# Patient Record
Sex: Male | Born: 1975 | Race: White | Hispanic: No | Marital: Married | Smoking: Former smoker
Health system: Southern US, Community
[De-identification: ages and names within clinical notes are randomized; demographics above are authoritative.]

## PROBLEM LIST (undated history)

## (undated) DIAGNOSIS — M1990 Unspecified osteoarthritis, unspecified site: Secondary | ICD-10-CM

## (undated) DIAGNOSIS — M2550 Pain in unspecified joint: Secondary | ICD-10-CM

## (undated) DIAGNOSIS — M549 Dorsalgia, unspecified: Secondary | ICD-10-CM

## (undated) DIAGNOSIS — E785 Hyperlipidemia, unspecified: Secondary | ICD-10-CM

## (undated) DIAGNOSIS — F909 Attention-deficit hyperactivity disorder, unspecified type: Secondary | ICD-10-CM

## (undated) HISTORY — DX: Dorsalgia, unspecified: M54.9

## (undated) HISTORY — PX: EYE SURGERY: SHX253

## (undated) HISTORY — PX: KNEE SURGERY: SHX244

## (undated) HISTORY — PX: HAND SURGERY: SHX662

## (undated) HISTORY — DX: Attention-deficit hyperactivity disorder, unspecified type: F90.9

## (undated) HISTORY — DX: Hyperlipidemia, unspecified: E78.5

## (undated) HISTORY — DX: Unspecified osteoarthritis, unspecified site: M19.90

## (undated) HISTORY — DX: Pain in unspecified joint: M25.50

---

## 2001-02-05 ENCOUNTER — Encounter (INDEPENDENT_AMBULATORY_CARE_PROVIDER_SITE_OTHER): Payer: Self-pay | Admitting: Specialist

## 2001-02-05 ENCOUNTER — Ambulatory Visit (HOSPITAL_BASED_OUTPATIENT_CLINIC_OR_DEPARTMENT_OTHER): Admission: RE | Admit: 2001-02-05 | Discharge: 2001-02-05 | Payer: Self-pay | Admitting: Plastic Surgery

## 2002-01-23 ENCOUNTER — Encounter: Payer: Self-pay | Admitting: Emergency Medicine

## 2002-01-23 ENCOUNTER — Encounter: Admission: RE | Admit: 2002-01-23 | Discharge: 2002-01-23 | Payer: Self-pay | Admitting: Emergency Medicine

## 2007-10-23 ENCOUNTER — Ambulatory Visit (HOSPITAL_BASED_OUTPATIENT_CLINIC_OR_DEPARTMENT_OTHER): Admission: RE | Admit: 2007-10-23 | Discharge: 2007-10-23 | Payer: Self-pay | Admitting: *Deleted

## 2009-05-18 ENCOUNTER — Encounter: Admission: RE | Admit: 2009-05-18 | Discharge: 2009-05-18 | Payer: Self-pay | Admitting: Family Medicine

## 2010-01-31 ENCOUNTER — Encounter: Admission: RE | Admit: 2010-01-31 | Discharge: 2010-01-31 | Payer: Self-pay | Admitting: Family Medicine

## 2010-09-13 ENCOUNTER — Encounter
Admission: RE | Admit: 2010-09-13 | Discharge: 2010-09-13 | Payer: Self-pay | Source: Home / Self Care | Attending: Family Medicine | Admitting: Family Medicine

## 2011-02-14 NOTE — Op Note (Signed)
Sean Hurst, Sean Hurst                 ACCOUNT NO.:  [id number]   MEDICAL RECORD NO.:  [id number]          PATIENT TYPE:  AMB   LOCATION:  DSC                          FACILITY:  [facility]   PHYSICIAN:  Lowell Bouton, M.D.DATE OF BIRTH:  Aug 15, 1976   DATE OF PROCEDURE:  10/23/2007  DATE OF DISCHARGE:                               OPERATIVE REPORT   PREOP DIAGNOSIS:  Malunion left fourth metacarpal shaft fracture.   POSTOPERATIVE DIAGNOSES:  Malunion left fourth metacarpal shaft  fracture.   PROCEDURE:  Open reduction internal fixation left fourth metacarpal  shaft fracture.   SURGEON:  Lowell Bouton, M.D.   ANESTHESIA:  General.   OPERATIVE FINDINGS:  The patient had an oblique fracture of the fourth  metacarpal shaft with a butterfly fragment that was just about healed.  There was significant callus around it but it was malrotated toward the  small finger.  The edges of the bone were rounded off and not sharp as  with an acute fracture.   PROCEDURE:  Under general anesthesia with a tourniquet on the left arm,  the left hand was prepped and draped in usual fashion.  After  exsanguinating the limb the tourniquet was inflated to 250 mmHg.  A  longitudinal incision was made over the dorsum of fourth metacarpal and  carried through the subcutaneous tissues.  Bleeding points were  coagulated.  Blunt dissection was carried down to the extensor tendon  and it was retracted ulnarly.  Longitudinal incision was made through  the periosteum down to the fourth metacarpal.  The fracture site was  identified and a Weitlaner retractor was inserted for retraction.  A  Freer elevator was used to elevate the periosteum and expose the  fracture site.  A rongeur was used to remove the callus that had formed  which was significant.  After taking down the fracture completely the  fracture was reduced as close to anatomic as could be determined with  two clamps.  An 0.45 K-wire was  placed temporarily obliquely across the  fracture to hold the rotation.  Clinically the rotation looked good.  A  mini fragment six-hole plate was then applied and after removing the K-  wire and the clamps the finger was still malrotated.  At this point the  plate was removed and the fracture was again reduced to get correct  rotation.  Another 0.35 K-wire was inserted temporarily along with a  0.45 K-wire at the fracture site.  A 0.45 longitudinal K-wire was then  placed from the MP joint proximally down the shaft of the fourth  metacarpal to the base of the metacarpal holding it aligned.  This pin  was bent over and left protruding from the skin.  A 0.45 K-wire was then  placed through the fifth metacarpal percutaneously into the distal  fragment of the fourth metacarpal holding it aligned.  Another 0.45 care  was then placed through the proximal fifth metacarpal into the proximal  fourth metacarpal holding it aligned.  A 0.35 K-wire was placed adjacent  to the more proximal 0.45 K-wire allowing two  proximal K-wires, one  distal K-wire and an a longitudinal K-wire.  The temporary K-wires were  removed.  X-rays showed good alignment and rotation was appropriate.  The K-wires were bent over and left protruding from the skin.  The wound  was irrigated with saline.  The periosteum was closed with 4-0 Vicryl, a  vessel loop drain was left in for drainage.  Half percent Marcaine was  placed in the skin edges for pain control.  Subcutaneous tissue was  closed with  4-0 Vicryl.  The skin with a 3-0 subcuticular Prolene.  Steri-Strips  were applied followed by sterile dressings and a dorsal splint.  The  tourniquet was released with good circulation of the hand.  The patient  went to recovery room awake and in stable condition.      Lowell Bouton, M.D.  Electronically Signed     EMM/MEDQ  D:  10/23/2007  T:  10/23/2007  Job:  [id number]

## 2011-02-17 NOTE — Op Note (Signed)
[place]  Patient:    Sean Hurst, Sean Hurst                        MRN: [id number] Proc. Date: 02/05/01 Adm. Date:  [id number] Attending:  Loura Halt Ii                           Operative Report  PREOPERATIVE DIAGNOSIS:  A 1 cm right lateral epidermoid cyst.  POSTOPERATIVE DIAGNOSIS:  A 1 cm right lateral epidermoid cyst.  OPERATION PERFORMED:  Excision right lateral epidermoid cyst.  SURGEON:  Alfredia Ferguson, M.D.  ANESTHESIA:  2% Xylocaine, 1:100,000 epinephrine.  INDICATIONS FOR PROCEDURE:  The patient is a 35 year old male with a history of a cyst in his right lateral eyebrow which has been present for quite some time.  It clinically appears to be a epidermoid cyst.  The patient wishes to have it excised.  He understands the risks of scarring and recurrence.  In spite of that he wishes to proceed with the surgery.  DESCRIPTION OF PROCEDURE:  A 1 cm curvilinear incision was made directly over the cyst and local anesthesia was infiltrated.  The area was prepped and draped in sterile fashion.  After waiting approximately 10 minutes, an incision was made over the cyst and dissection of the skin off the cyst was carried out.  The cyst was dissected away from the surrounding tissue.  It was passed off for pathology.  Hemostasis was accomplished using pressure.  The wound was closed using multiple interrupted 6-0 nylon sutures.  A light dressing was applied and the patient was discharged home in satisfactory condition. DD:  02/05/01 TD:  02/05/01 Job: [id number]

## 2011-06-23 LAB — POCT HEMOGLOBIN-HEMACUE: Hemoglobin: 15

## 2012-12-16 ENCOUNTER — Ambulatory Visit
Admission: RE | Admit: 2012-12-16 | Discharge: 2012-12-16 | Disposition: A | Discharge status: Home / Self Care | Payer: 59 | Source: Ambulatory Visit | Attending: Family Medicine | Admitting: Family Medicine

## 2012-12-16 ENCOUNTER — Other Ambulatory Visit: Payer: Self-pay | Admitting: Family Medicine

## 2012-12-16 DIAGNOSIS — R609 Edema, unspecified: Secondary | ICD-10-CM

## 2012-12-16 DIAGNOSIS — R52 Pain, unspecified: Secondary | ICD-10-CM

## 2013-06-19 ENCOUNTER — Ambulatory Visit
Admission: RE | Admit: 2013-06-19 | Discharge: 2013-06-19 | Disposition: A | Discharge status: Home / Self Care | Payer: 59 | Source: Ambulatory Visit | Attending: Family Medicine | Admitting: Family Medicine

## 2013-06-19 ENCOUNTER — Other Ambulatory Visit: Payer: Self-pay | Admitting: Family Medicine

## 2013-06-19 ENCOUNTER — Other Ambulatory Visit: Payer: Self-pay | Admitting: Pediatrics

## 2013-06-19 DIAGNOSIS — M25561 Pain in right knee: Secondary | ICD-10-CM

## 2014-10-09 ENCOUNTER — Other Ambulatory Visit: Payer: Self-pay | Admitting: Orthopaedic Surgery

## 2014-10-09 DIAGNOSIS — M25561 Pain in right knee: Secondary | ICD-10-CM

## 2014-10-14 ENCOUNTER — Ambulatory Visit
Admission: RE | Admit: 2014-10-14 | Discharge: 2014-10-14 | Disposition: A | Discharge status: Home / Self Care | Payer: 59 | Source: Ambulatory Visit | Attending: Orthopaedic Surgery | Admitting: Orthopaedic Surgery

## 2014-10-14 DIAGNOSIS — M25561 Pain in right knee: Secondary | ICD-10-CM

## 2015-02-24 ENCOUNTER — Ambulatory Visit (INDEPENDENT_AMBULATORY_CARE_PROVIDER_SITE_OTHER): Payer: 59 | Admitting: Family Medicine

## 2015-02-24 ENCOUNTER — Ambulatory Visit (INDEPENDENT_AMBULATORY_CARE_PROVIDER_SITE_OTHER): Payer: 59

## 2015-02-24 VITALS — BP 110/70 | HR 81 | Temp 97.9°F | Resp 16 | Ht 79.0 in | Wt 318.0 lb

## 2015-02-24 DIAGNOSIS — S6991XA Unspecified injury of right wrist, hand and finger(s), initial encounter: Secondary | ICD-10-CM

## 2015-02-24 DIAGNOSIS — M79644 Pain in right finger(s): Secondary | ICD-10-CM | POA: Diagnosis not present

## 2015-02-24 NOTE — Progress Notes (Signed)
  Subjective:  Patient ID: Sean ClicheMark H Cangemi, male    DOB: 1976/01/03  Age: 39 y.o. MRN: [id number]  Patient was playing with his kids and let them tackle him. When he went down he felt his finger go snap. He is right-handed. Has not had any prior problems with it. It hurts in the proximal joint.   Objective:   Mild lateral deviation of PIP joint of middle finger, swollen. Tender to touch running around the joint space. Minimal flexion and extension. The bones on either side of the joint don't seem terribly tender. Sensation is intact.  [facility] reading (PRIMARY) by  Dr. Alwyn RenHopper. No fracture.  Mild    Assessment & Plan:   Assessment: Pain, strain right middle finger  Plan: Patient Instructions  Take ibuprofen 800 mg (4200) 3 times daily or Aleve (naproxen) 440 mg (2220) twice daily  Referral is being made to an orthopedic hand specialist  Keep the finger buddy taped  Apply ice for about 15 minutes for 5 times a day for the next few days  Return at anytime if we can be of assistance     HOPPER,DAVID, MD 02/24/2015

## 2015-02-24 NOTE — Patient Instructions (Signed)
Take ibuprofen 800 mg (4200) 3 times daily or Aleve (naproxen) 440 mg (2220) twice daily  Referral is being made to an orthopedic hand specialist  Keep the finger buddy taped  Apply ice for about 15 minutes for 5 times a day for the next few days  Return at anytime if we can be of assistance

## 2015-04-19 ENCOUNTER — Ambulatory Visit (INDEPENDENT_AMBULATORY_CARE_PROVIDER_SITE_OTHER): Payer: 59

## 2015-04-19 ENCOUNTER — Ambulatory Visit (INDEPENDENT_AMBULATORY_CARE_PROVIDER_SITE_OTHER): Payer: 59 | Admitting: Family Medicine

## 2015-04-19 VITALS — BP 118/78 | HR 85 | Temp 97.9°F | Resp 16 | Ht 78.0 in | Wt 313.0 lb

## 2015-04-19 DIAGNOSIS — M79641 Pain in right hand: Secondary | ICD-10-CM

## 2015-04-19 DIAGNOSIS — S62309A Unspecified fracture of unspecified metacarpal bone, initial encounter for closed fracture: Secondary | ICD-10-CM | POA: Diagnosis not present

## 2015-04-19 DIAGNOSIS — S62339A Displaced fracture of neck of unspecified metacarpal bone, initial encounter for closed fracture: Secondary | ICD-10-CM

## 2015-04-19 NOTE — Patient Instructions (Addendum)
Please wear the splint when working and if using right hand.  You can take it off to shower, air out as needed.  I've referred you to hand and they will be in contact with you to get that scheduled.    Boxer's Fracture You have a break (fracture) of the fifth metacarpal bone. This is commonly called a boxer's fracture. This is the bone in the hand where the little finger attaches. The fracture is in the end of that bone, closest to the little finger. It is usually caused when you hit an object with a clenched fist. Often, the knuckle is pushed down by the impact. Sometimes, the fracture rotates out of position. A boxer's fracture will usually heal within 6 weeks, if it is treated properly and protected from re-injury. Surgery is sometimes needed. A cast, splint, or bulky hand dressing may be used to protect and immobilize a boxer's fracture. Do not remove this device or dressing until your caregiver approves. Keep your hand elevated, and apply ice packs for 15-20 minutes every 2 hours, for the first 2 days. Elevation and ice help reduce swelling and relieve pain. See your caregiver, or an orthopedic specialist, for follow-up care within the next 10 days. This is to make sure your fracture is healing properly. Document Released: 09/18/2005 Document Revised: 12/11/2011 Document Reviewed: 03/08/2007 [facility] Patient Information 2015 [place], [place]. This information is not intended to replace advice given to you by your health care provider. Make sure you discuss any questions you have with your health care provider.

## 2015-04-19 NOTE — Progress Notes (Signed)
   Subjective:    Patient ID: Sean Hurst, male    DOB: 1976/08/30, 39 y.o.   MRN: [id number]  Chief Complaint  Patient presents with  . pain in right hand    x 2wks   There are no active problems to display for this patient.  Prior to Admission medications   Not on File   Medications, allergies, past medical history, surgical history, family history, social history and problem list reviewed and updated.  HPI  39 yom presents with right hand pain.   Was seen here 5/26 with right 3rd digit injury. Xrays normal. Was referred to hand but never saw them as pain improved.   Presents today with right hand pain. His dog got into fight with another dog and he had to hit his dog in jaw with closed fist to try to get dog to release from another dog. Tender/swollen over right 5th digit past 2 wks since then.   Review of Systems No fevers, chills.     Objective:   Physical Exam  Constitutional: He is oriented to person, place, and time. He appears well-developed and well-nourished.  Non-toxic appearance. He does not have a sickly appearance. He does not appear ill. No distress.  BP 118/78 mmHg  Pulse 85  Temp(Src) 97.9 F (36.6 C) (Oral)  Resp 16  Ht 6\' 6"  (1.981 m)  Wt 313 lb (141.976 kg)  BMI 36.18 kg/m2  SpO2 97%   Musculoskeletal:       Right elbow: Normal.      Right wrist: Normal.       Right hand: He exhibits tenderness and bony tenderness. He exhibits normal range of motion. Normal sensation noted. Normal strength noted.       Hands: Swelling, tenderness centered over distal 5th metatarsal.   Neurological: He is alert and oriented to person, place, and time.   [facility] reading (PRIMARY) by  Dr. Alwyn RenHopper. Right hand findings: Distal 5th metacarpal fx. Angulated 35 degrees.      Assessment & Plan:   Right hand pain - Plan: DG Hand Complete Right --boxers fx --ulnar gutter splint placed --referral to hand placed urgently today  Donnajean Lopesodd M. Ambrie Carte, PA-C Physician  Assistant-Certified [facility]  04/19/2015 2:16 PM

## 2015-04-19 NOTE — Progress Notes (Signed)
  Subjective:  Patient ID: Sean ClicheMark H Norden, male    DOB: 07-21-1976  Age: 39 y.o. MRN: [id number]  Patient struck the dog trying to break up a dog fight, injuring his right hand 2 weeks ago.   McVay, PA-C and I discussed this.   Objective:   X-ray was reviewed and the boxer's fracture at an angle of about 35 was noted in the distal right fifth metacarpal.  Assessment & Plan:   Assessment: Fracture fifth metacarpal, angulated  Treatment plan discussed and agreed upon. Will be referred to orthopedics for hand evaluation. I spoke to patient briefly.  Plan:  Per orders and instructions. HOPPER,DAVID, MD 04/19/2015

## 2017-07-11 ENCOUNTER — Ambulatory Visit
Admission: RE | Admit: 2017-07-11 | Discharge: 2017-07-11 | Disposition: A | Discharge status: Home / Self Care | Payer: 59 | Source: Ambulatory Visit | Attending: Family Medicine | Admitting: Family Medicine

## 2017-07-11 ENCOUNTER — Other Ambulatory Visit: Payer: Self-pay | Admitting: Family Medicine

## 2017-07-11 DIAGNOSIS — M25562 Pain in left knee: Secondary | ICD-10-CM

## 2020-02-25 ENCOUNTER — Other Ambulatory Visit: Payer: Self-pay

## 2020-02-25 ENCOUNTER — Other Ambulatory Visit: Payer: Self-pay | Admitting: Family Medicine

## 2020-02-25 ENCOUNTER — Ambulatory Visit
Admission: RE | Admit: 2020-02-25 | Discharge: 2020-02-25 | Disposition: A | Discharge status: Home / Self Care | Payer: 59 | Source: Ambulatory Visit | Attending: Family Medicine | Admitting: Family Medicine

## 2020-02-25 DIAGNOSIS — M25421 Effusion, right elbow: Secondary | ICD-10-CM

## 2020-12-23 ENCOUNTER — Ambulatory Visit (INDEPENDENT_AMBULATORY_CARE_PROVIDER_SITE_OTHER): Payer: 59

## 2020-12-23 ENCOUNTER — Other Ambulatory Visit: Payer: Self-pay

## 2020-12-23 ENCOUNTER — Ambulatory Visit: Payer: 59 | Admitting: Podiatry

## 2020-12-23 ENCOUNTER — Encounter: Payer: Self-pay | Admitting: Podiatry

## 2020-12-23 DIAGNOSIS — M7662 Achilles tendinitis, left leg: Secondary | ICD-10-CM

## 2020-12-23 DIAGNOSIS — M722 Plantar fascial fibromatosis: Secondary | ICD-10-CM

## 2020-12-23 NOTE — Progress Notes (Signed)
Subjective:   Patient ID: Sean Hurst, male   DOB: 45 y.o.   MRN: [id number]   HPI Patient states he had severe pain in the back of his left heel that he wanted to get checked states it is improving but still has concerns and has numerous questions concerning foot pain.  Patient does not smoke likes to be active   Review of Systems  All other systems reviewed and are negative.       Objective:  Physical Exam Vitals and nursing note reviewed.  Constitutional:      Appearance: He is well-developed.  Pulmonary:     Effort: Pulmonary effort is normal.  Musculoskeletal:        General: Normal range of motion.  Skin:    General: Skin is warm.  Neurological:     Mental Status: He is alert.     Neurovascular status found to be intact muscle strength adequate range of motion adequate mild equinus noted bilateral.  Posterior discomfort still present left heel there does appear to be some enlargement of the posterior heel with possibility for bone spur formation with patient found to have good digital perfusion well oriented x3 with a weightbearing job with lots of walking and standing     Assessment:  Achilles tendinitis left with probable spur formation with inflammation that developed     Plan:  H&P reviewed condition and at this point we discussed ice therapy orthotic therapy possibly heel lift therapy and topical medications.  I did explain it may require more aggressive conservative approach if it were to flareup again and that there is spur formation which may need to be addressed at 1 point in the future and I did educate him on the possibility for surgical intervention  X-rays indicate that there is some posterior spur formation of the left heel and moderate on the plantar aspect of the heel

## 2020-12-23 NOTE — Patient Instructions (Signed)

## 2021-06-08 ENCOUNTER — Other Ambulatory Visit: Payer: Self-pay | Admitting: Family Medicine

## 2021-06-08 ENCOUNTER — Ambulatory Visit
Admission: RE | Admit: 2021-06-08 | Discharge: 2021-06-08 | Disposition: A | Discharge status: Home / Self Care | Payer: 59 | Source: Ambulatory Visit | Attending: Family Medicine | Admitting: Family Medicine

## 2021-06-08 DIAGNOSIS — R52 Pain, unspecified: Secondary | ICD-10-CM

## 2022-01-18 ENCOUNTER — Ambulatory Visit
Admission: RE | Admit: 2022-01-18 | Discharge: 2022-01-18 | Disposition: A | Discharge status: Home / Self Care | Payer: 59 | Source: Ambulatory Visit | Attending: Family Medicine | Admitting: Family Medicine

## 2022-01-18 ENCOUNTER — Other Ambulatory Visit: Payer: Self-pay | Admitting: Family Medicine

## 2022-01-18 DIAGNOSIS — R52 Pain, unspecified: Secondary | ICD-10-CM

## 2022-01-25 ENCOUNTER — Ambulatory Visit: Payer: 59 | Admitting: Orthopaedic Surgery

## 2022-01-25 ENCOUNTER — Encounter: Payer: Self-pay | Admitting: Orthopaedic Surgery

## 2022-01-25 DIAGNOSIS — M1711 Unilateral primary osteoarthritis, right knee: Secondary | ICD-10-CM | POA: Diagnosis not present

## 2022-01-25 MED ORDER — METHYLPREDNISOLONE ACETATE 40 MG/ML IJ SUSP
40.0000 mg | INTRAMUSCULAR | Status: AC | PRN
  Administered 2022-01-25: 40 mg via INTRA_ARTICULAR

## 2022-01-25 MED ORDER — LIDOCAINE HCL 1 % IJ SOLN
3.0000 mL | INTRAMUSCULAR | Status: AC | PRN
  Administered 2022-01-25: 3 mL

## 2022-01-25 NOTE — Progress Notes (Signed)
? ?Office Visit Note ?  ?Patient: Sean Hurst           ?Date of Birth: 1975-10-14           ?MRN: [id number] ?Visit Date: 01/25/2022 ?             ?Requested by: Mosetta Putt, MD ?[address] ?[place],  [place] [address] ?PCP: Mosetta Putt, MD ? ? ?Assessment & Plan: ?Visit Diagnoses:  ?1. Primary osteoarthritis of right knee   ? ? ?Plan: Discussed with him knee friendly exercises and quad strengthening.  Also discussed weight loss.  Recommend use of topical Voltaren gel 4 g up to 4 times daily.  He also ask about knee brace which I think could be beneficial he will pick this up off the shelf.  We will see how he does with the cortisone injection if he has short lived relief then may consider supplemental injection handout on supplemental injection was given.  Questions were encouraged and answered by Dr. [name] and myself. ? ?Follow-Up Instructions: No follow-ups on file.  ? ?Orders:  ?Orders Placed This Encounter  ?Procedures  ? Large Joint Inj  ? ?No orders of the defined types were placed in this encounter. ? ? ? ? Procedures: ?Large Joint Inj: R knee on 01/25/2022 10:21 AM ?Indications: pain ?Details: 22 G 1.5 in needle, anterolateral approach ? ?Arthrogram: No ? ?Medications: 3 mL lidocaine 1 %; 40 mg methylPREDNISolone acetate 40 MG/ML ?Outcome: tolerated well, no immediate complications ?Procedure, treatment alternatives, risks and benefits explained, specific risks discussed. Consent was given by the patient. Immediately prior to procedure a time out was called to verify the correct patient, procedure, equipment, support staff and site/side marked as required. Patient was prepped and draped in the usual sterile fashion.  ? ? ? ? ?Clinical Data: ?No additional findings. ? ? ?Subjective: ?Chief Complaint  ?Patient presents with  ? Right Knee - Pain  ? ? ?HPI ?Sean Hurst is 46 year old male comes in today with right knee pain has been ongoing for about a month and a half.  No known injury.  States pain is  worse on some days than others.  He notes pain medial aspect of the knee.  Does have a history of knee arthroscopy with what sounds like a partial medial meniscectomy in 2015.  He has tried Advil ibuprofen without much relief.  Denies any mechanical symptoms right knee.  Points to the medial aspect of the knee as source of pain.  Patient is nondiabetic. ?Radiographs right knee dated 01/18/2022 showed moderate medial compartmental narrowing and mild patellofemoral arthritic changes.  No acute fractures.  Lateral compartment is well-maintained.  Knee is well located. ? ?Review of Systems  ?Constitutional:  Negative for chills and fever.  ? ? ?Objective: ?Vital Signs: There were no vitals taken for this visit. ? ?Physical Exam ?Constitutional:   ?   Appearance: He is not ill-appearing or diaphoretic.  ?Pulmonary:  ?   Effort: Pulmonary effort is normal.  ?Neurological:  ?   Mental Status: He is alert and oriented to person, place, and time.  ?Psychiatric:     ?   Mood and Affect: Mood normal.  ? ? ?Ortho Exam ?Bilateral knees: Full extension full flexion.  Right knee with slight patellofemoral crepitus.  Tenderness along medial joint line right knee only.  No instability with valgus varus stressing of either knee.  No abnormal warmth erythema or effusion of either knee. ?Specialty Comments:  ?No specialty comments available. ? ?  Imaging: ?No results found. ? ? ?PMFS History: ?There are no problems to display for this patient. ? ?No past medical history on file.  ?No family history on file.  ?Past Surgical History:  ?Procedure Laterality Date  ? EYE SURGERY    ? HAND SURGERY Left   ? KNEE SURGERY Right   ? ?Social History  ? ?Occupational History  ? Not on file  ?Tobacco Use  ? Smoking status: Never  ? Smokeless tobacco: Not on file  ?Substance and Sexual Activity  ? Alcohol use: Not on file  ? Drug use: Not on file  ? Sexual activity: Not on file  ? ? ? ? ? ? ?

## 2022-08-09 ENCOUNTER — Ambulatory Visit: Payer: 59 | Admitting: Orthopaedic Surgery

## 2022-08-09 ENCOUNTER — Telehealth: Payer: 59

## 2022-08-09 VITALS — Ht 78.0 in | Wt 328.0 lb

## 2022-08-09 DIAGNOSIS — M25561 Pain in right knee: Secondary | ICD-10-CM

## 2022-08-09 DIAGNOSIS — M1711 Unilateral primary osteoarthritis, right knee: Secondary | ICD-10-CM | POA: Diagnosis not present

## 2022-08-09 DIAGNOSIS — G8929 Other chronic pain: Secondary | ICD-10-CM

## 2022-08-09 MED ORDER — METHYLPREDNISOLONE ACETATE 40 MG/ML IJ SUSP
40.0000 mg | INTRAMUSCULAR | Status: AC | PRN
  Administered 2022-08-09: 40 mg via INTRA_ARTICULAR

## 2022-08-09 MED ORDER — LIDOCAINE HCL 1 % IJ SOLN
3.0000 mL | INTRAMUSCULAR | Status: AC | PRN
  Administered 2022-08-09: 3 mL

## 2022-08-09 NOTE — Telephone Encounter (Signed)
Right knee gel injection  

## 2022-08-09 NOTE — Progress Notes (Addendum)
The patient is well-known to the clinic.  He is an active 46 year old gentleman who works outside in for [employer].  He has moderate arthritis in his right about a month ago.  He is definitely candidate for hyaluronic acid.  Previous x-rays showed knee.  He has had a steroid injection back in the spring and that helped him greatly until some joint space narrowing and osteophytes around the knee but not bone-on-bone wear.  He denies any locking catching in his knee is more painful when he has been up on the elevated long.  He has had no acute change in medical status.  He is a very tall individual and his BMI is almost 38.  Examination of his left knee today is normal.  Examination of the right knee shows some medial joint line tenderness and patellofemoral rotation but good range of motion overall and no instability on exam.  His Lachman's and McMurray's exams are negative.  I did place a steroid injection in his right knee today while we wait to get approval for hyaluronic acid for his right knee.  I believe that is the best treatment option for treating the pain from osteoarthritis of his left knee.  We will hopefully get this ordered for him and approved.  We will see him in follow-up to place an injection in his right knee.  All question concerns were answered and addressed.       Procedure Note  Patient: Sean Hurst             Date of Birth: 11-21-1975           MRN: [id number]             Visit Date: 08/09/2022  Procedures: Visit Diagnoses:  1. Chronic pain of right knee   2. Unilateral primary osteoarthritis, right knee     Large Joint Inj: R knee on 08/09/2022 3:47 PM Indications: diagnostic evaluation and pain Details: 22 G 1.5 in needle, superolateral approach  Arthrogram: No  Medications: 3 mL lidocaine 1 %; 40 mg methylPREDNISolone acetate 40 MG/ML Outcome: tolerated well, no immediate complications Procedure, treatment alternatives, risks and benefits explained,  specific risks discussed. Consent was given by the patient. Immediately prior to procedure a time out was called to verify the correct patient, procedure, equipment, support staff and site/side marked as required. Patient was prepped and draped in the usual sterile fashion.

## 2022-08-11 NOTE — Telephone Encounter (Signed)
VOB submitted for Durolane, right knee.  

## 2022-08-23 ENCOUNTER — Other Ambulatory Visit (HOSPITAL_BASED_OUTPATIENT_CLINIC_OR_DEPARTMENT_OTHER): Payer: Self-pay | Admitting: Geriatric Medicine

## 2022-08-23 DIAGNOSIS — E785 Hyperlipidemia, unspecified: Secondary | ICD-10-CM

## 2022-10-04 ENCOUNTER — Encounter (HOSPITAL_BASED_OUTPATIENT_CLINIC_OR_DEPARTMENT_OTHER): Payer: Self-pay

## 2022-10-04 ENCOUNTER — Ambulatory Visit (HOSPITAL_BASED_OUTPATIENT_CLINIC_OR_DEPARTMENT_OTHER)
Admission: RE | Admit: 2022-10-04 | Discharge: 2022-10-04 | Disposition: A | Discharge status: Home / Self Care | Payer: 59 | Source: Ambulatory Visit | Attending: Geriatric Medicine | Admitting: Geriatric Medicine

## 2022-10-04 DIAGNOSIS — E785 Hyperlipidemia, unspecified: Secondary | ICD-10-CM | POA: Insufficient documentation

## 2022-10-12 ENCOUNTER — Other Ambulatory Visit (HOSPITAL_COMMUNITY): Payer: Self-pay | Admitting: Family Medicine

## 2022-10-12 DIAGNOSIS — E785 Hyperlipidemia, unspecified: Secondary | ICD-10-CM

## 2022-11-21 ENCOUNTER — Telehealth: Payer: Self-pay | Admitting: Orthopaedic Surgery

## 2022-11-21 NOTE — Telephone Encounter (Signed)
Patient wants to Know if Gel shot was approved [id number]

## 2022-11-21 NOTE — Telephone Encounter (Signed)
Talked with patient concerning gel injection and advised him that gel injection would need to be resubmitted due to it being a new year.  Patient voiced that he understands.

## 2022-11-22 ENCOUNTER — Telehealth: Payer: Self-pay

## 2022-11-22 DIAGNOSIS — M1711 Unilateral primary osteoarthritis, right knee: Secondary | ICD-10-CM

## 2022-11-22 NOTE — Telephone Encounter (Signed)
Called and left a VM for patient to CB to schedule for gel injection with Dr. Ninfa Linden or Artis Delay.  Check referral tab

## 2022-12-04 ENCOUNTER — Encounter: Payer: Self-pay | Admitting: Physician Assistant

## 2022-12-04 ENCOUNTER — Ambulatory Visit: Payer: 59 | Admitting: Physician Assistant

## 2022-12-04 DIAGNOSIS — M1711 Unilateral primary osteoarthritis, right knee: Secondary | ICD-10-CM | POA: Diagnosis not present

## 2022-12-04 MED ORDER — LIDOCAINE HCL 1 % IJ SOLN
3.0000 mL | INTRAMUSCULAR | Status: AC | PRN
  Administered 2022-12-04: 3 mL

## 2022-12-04 MED ORDER — SODIUM HYALURONATE 60 MG/3ML IX PRSY
60.0000 mg | PREFILLED_SYRINGE | INTRA_ARTICULAR | Status: AC | PRN
  Administered 2022-12-04: 60 mg via INTRA_ARTICULAR

## 2022-12-04 NOTE — Progress Notes (Signed)
   Procedure Note  Patient: Sean Hurst             Date of Birth: 1976-07-22           MRN: [id number]             Visit Date: 12/04/2022  HPI: Mr. Ambrogio comes in today for scheduled Durolane injection in his right knee.  Again he has known arthritis involving the medial compartment which is moderate on radiograph.  Mild patellofemoral changes.  Said no new injuries no changes to his overall pain in the knee.  He has tried cortisone injections without significant relief.  Also he has tried NSAIDs without much relief.  He has no scheduled knee surgery on the right knee in the next 6 months.  Physical exam: General well-developed well-nourished male no acute distress Right knee: No abnormal warmth or erythema.  Slight effusion.  Overall good range of motion of the knee.  Procedures: Visit Diagnoses:  1. Unilateral primary osteoarthritis, right knee     Large Joint Inj: R knee on 12/04/2022 4:34 PM Indications: pain Details: 22 G 1.5 in needle, superolateral approach  Arthrogram: No  Medications: 3 mL lidocaine 1 %; 60 mg Sodium Hyaluronate 60 MG/3ML Aspirate: 7 mL blood-tinged Outcome: tolerated well, no immediate complications Procedure, treatment alternatives, risks and benefits explained, specific risks discussed. Consent was given by the patient. Immediately prior to procedure a time out was called to verify the correct patient, procedure, equipment, support staff and site/side marked as required. Patient was prepped and draped in the usual sterile fashion.    Plan: Knee was wrapped with Ace bandages which she will remove this evening.  He knows to wait at least 6 months between injections.  Questions were encouraged and answered at length

## 2022-12-07 ENCOUNTER — Encounter: Payer: Self-pay | Admitting: Radiology

## 2022-12-27 ENCOUNTER — Other Ambulatory Visit (INDEPENDENT_AMBULATORY_CARE_PROVIDER_SITE_OTHER): Payer: 59

## 2022-12-27 ENCOUNTER — Ambulatory Visit: Payer: 59 | Admitting: Orthopaedic Surgery

## 2022-12-27 DIAGNOSIS — M25551 Pain in right hip: Secondary | ICD-10-CM

## 2022-12-27 DIAGNOSIS — M25561 Pain in right knee: Secondary | ICD-10-CM

## 2022-12-27 DIAGNOSIS — G8929 Other chronic pain: Secondary | ICD-10-CM

## 2022-12-27 MED ORDER — METHYLPREDNISOLONE ACETATE 40 MG/ML IJ SUSP
40.0000 mg | INTRAMUSCULAR | Status: AC | PRN
  Administered 2022-12-27: 40 mg via INTRA_ARTICULAR

## 2022-12-27 MED ORDER — LIDOCAINE HCL 1 % IJ SOLN
3.0000 mL | INTRAMUSCULAR | Status: AC | PRN
  Administered 2022-12-27: 3 mL

## 2022-12-27 NOTE — Progress Notes (Signed)
The patient is well-known to [place].  He is 47 years old and we have been treating him for mild to moderate arthritis of his right knee.  He has had a steroid injection in that knee that helped greatly but a hyaluronic acid injection he had just a month ago does not really help much at all.  He still has medial joint line tenderness along that right knee.  His job does involve a lot of walking.  He is mainly here today though for his right hip.  He has been having lateral hip pain since July of last year which has been about 8 months now.  He had a very hard mechanical fall then landing directly on that hip and had severe bruising.  He denies any groin pain but he has been still continuing to have pain on his right hip over the trochanteric area but also the medial aspect of his right knee.  He did let us know that the co-pay for his gel injection was $500.  On exam his right hip moves smoothly and fluidly.  There is pain over the trochanteric area only in the proximal IT band of that hip.  An AP pelvis and lateral of the right hip are normal.  I did recommend a steroid injection around the trochanteric area of his right hip and he agreed to this and tolerated it well.  I would like to obtain an MRI of his right knee at this standpoint given the fact that an MRI in 2016 was entirely normal in terms of no cartilage deficit but now he is experiencing enough pain and is failing conservative treatment including activity modification, physical therapy, steroid injections and now hyaluronic acid injections of the right knee.  The MRI will help assess what the cartilage looks like as well as if there is a meniscal tear.  He may end up benefiting from the medial compartment offloading knee brace but we need to see what the MRI shows first.  He will call for follow-up appointment once we know when he has the MRI of his right knee.     Procedure Note  Patient: Sean Hurst             Date of Birth: 18-Mar-1976            MRN: [id number]             Visit Date: 12/27/2022  Procedures: Visit Diagnoses:  1. Pain in right hip   2. Chronic pain of right knee     Large Joint Inj: R greater trochanter on 12/27/2022 4:23 PM Indications: pain and diagnostic evaluation Details: 22 G 1.5 in needle, lateral approach  Arthrogram: No  Medications: 3 mL lidocaine 1 %; 40 mg methylPREDNISolone acetate 40 MG/ML Outcome: tolerated well, no immediate complications Procedure, treatment alternatives, risks and benefits explained, specific risks discussed. Consent was given by the patient. Immediately prior to procedure a time out was called to verify the correct patient, procedure, equipment, support staff and site/side marked as required. Patient was prepped and draped in the usual sterile fashion.

## 2023-01-27 IMAGING — CR DG KNEE COMPLETE 4+V*R*
4 series · 4 of 4 positions shown · non-contrast
Comparison: X-ray 06/19/2013.

CLINICAL DATA: Right medial knee pain.

EXAM:
RIGHT KNEE - COMPLETE 4+ VIEW

[w knee ap right]
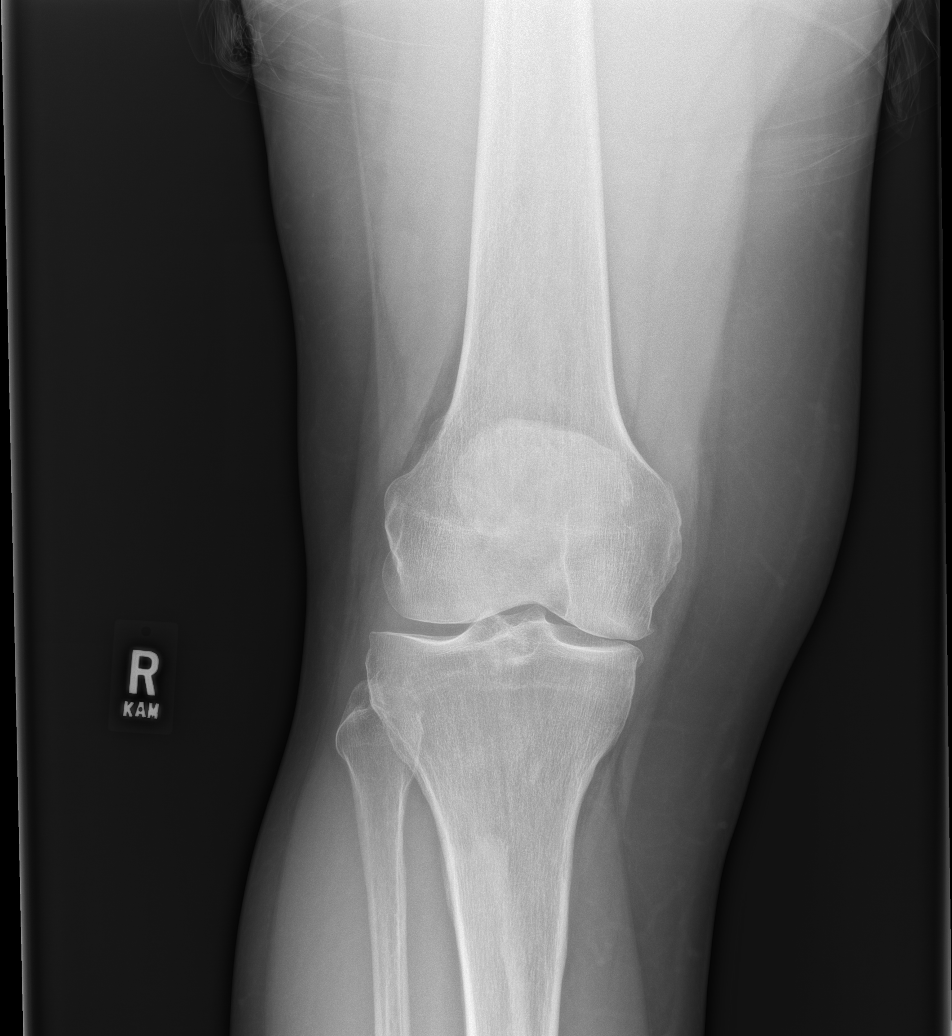

[w knee lat right]
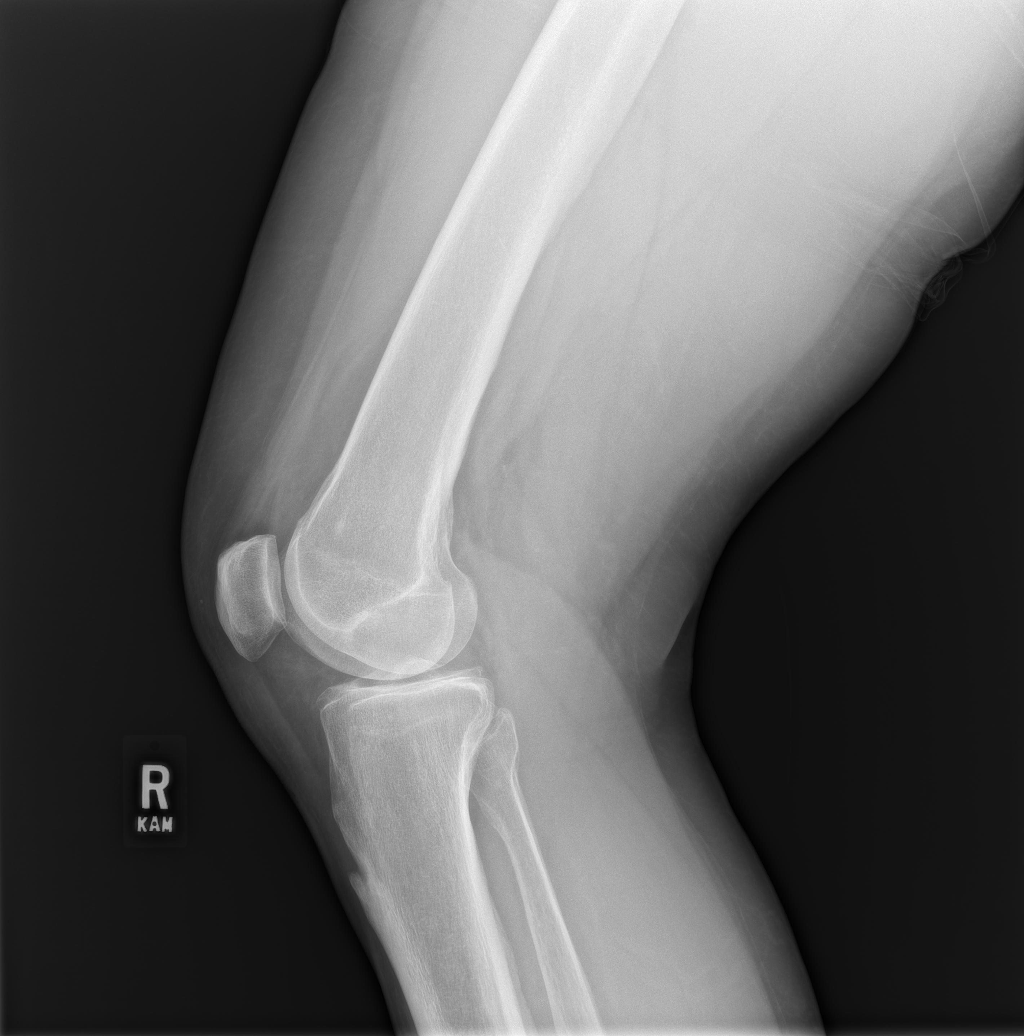

[w knee tunnel pa right]
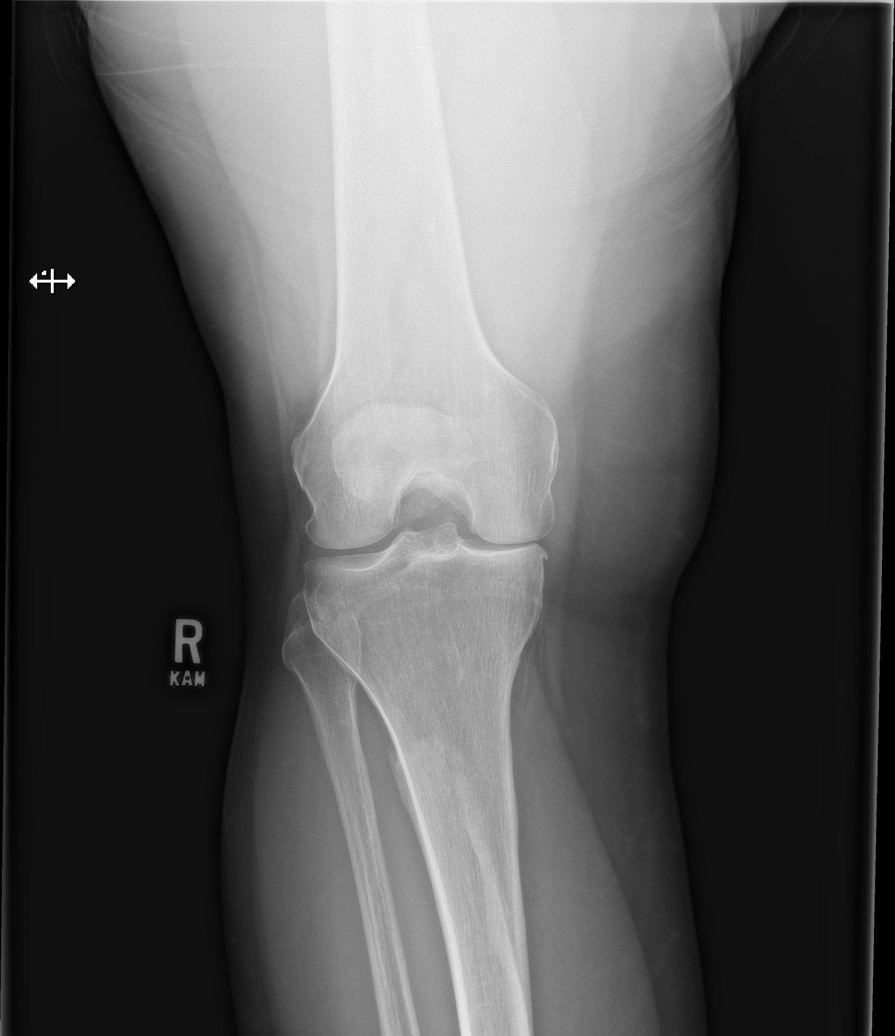

[x knee sunrise right]
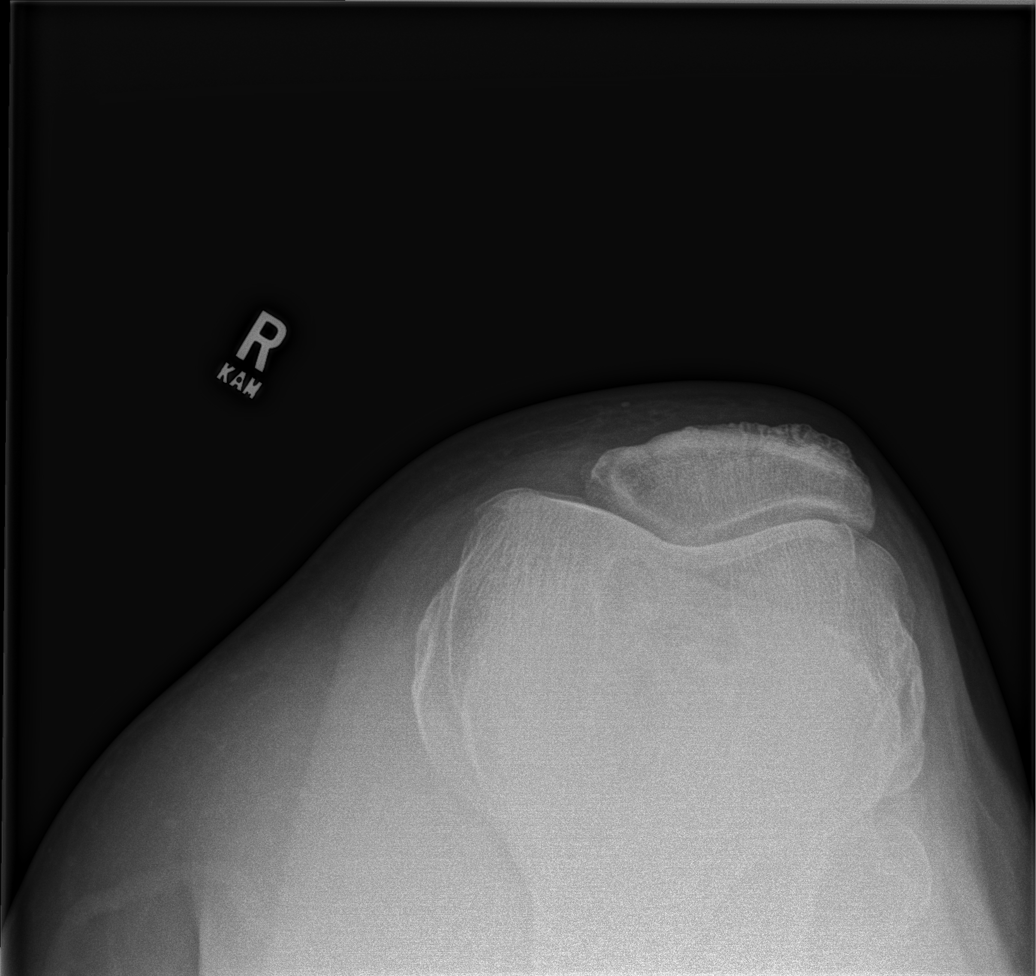

[4 of 4 positions shown; findings below may reference images not displayed]

FINDINGS: Development of moderate medial compartment joint space narrowing and
osteophyte formation. Mild patellofemoral compartment joint space
narrowing. No joint effusion.
IMPRESSION: Development of 2 compartment osteoarthritis, without acute
superimposed process.

## 2023-04-27 ENCOUNTER — Other Ambulatory Visit: Payer: Self-pay | Admitting: *Deleted

## 2023-04-27 DIAGNOSIS — M25551 Pain in right hip: Secondary | ICD-10-CM

## 2023-05-12 ENCOUNTER — Other Ambulatory Visit: Payer: 59

## 2023-05-19 ENCOUNTER — Ambulatory Visit
Admission: RE | Admit: 2023-05-19 | Discharge: 2023-05-19 | Disposition: A | Discharge status: Home / Self Care | Payer: 59 | Source: Ambulatory Visit | Attending: Orthopaedic Surgery | Admitting: Orthopaedic Surgery

## 2023-05-19 DIAGNOSIS — M25551 Pain in right hip: Secondary | ICD-10-CM

## 2023-05-29 ENCOUNTER — Other Ambulatory Visit (INDEPENDENT_AMBULATORY_CARE_PROVIDER_SITE_OTHER): Payer: 59

## 2023-05-29 ENCOUNTER — Ambulatory Visit: Payer: 59 | Admitting: Orthopaedic Surgery

## 2023-05-29 ENCOUNTER — Encounter: Payer: Self-pay | Admitting: Orthopaedic Surgery

## 2023-05-29 DIAGNOSIS — G8929 Other chronic pain: Secondary | ICD-10-CM

## 2023-05-29 DIAGNOSIS — M25561 Pain in right knee: Secondary | ICD-10-CM

## 2023-05-29 DIAGNOSIS — M1711 Unilateral primary osteoarthritis, right knee: Secondary | ICD-10-CM | POA: Insufficient documentation

## 2023-05-29 MED ORDER — CELECOXIB 200 MG PO CAPS: 200.0000 mg | ORAL_CAPSULE | Freq: Two times a day (BID) | ORAL | 3 refills | Status: DC | PRN

## 2023-05-29 NOTE — Progress Notes (Signed)
The patient is a 47 year old gentleman well-known to me.  He is 6 foot 7 and probably weighs close to 340 pounds.  We have seen him for his right knee in the past and a MRI from 2016 showed only minimal cartilage changes in that knee.  We had tried steroid injections and hyaluronic acid in his knee had not gotten better.  I saw him back in March of this year and he was also complaining of hip pain after a hard mechanical fall.  X-rays of that hip did not show any type of fracture or any worrisome features.  At that time we placed a steroid injection in his right knee but we also recommended a MRI of his right knee to assess the cartilage given his worsening pain in the failure conservative treatment.  Somehow there was some miscommunication and he ended up getting an MRI of his right hip which only showed some mild arthritis.  I am not sure what happened in terms of the MRI because we had dictated we wanted this for his right knee.  On exam today his right hip exam is normal.  His right knee has varus malalignment with significant medial joint line tenderness and patellofemoral tenderness.  There is good range of motion of that knee.  I did obtain x-rays of the right knee today and compared them to films from April 2023.  His arthritis is significantly worsen with the right knee to the point that I really do not need an MRI of that right knee.  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  He definitely would benefit from weight loss given his obesity.  He understands that as well.  I would like to send in some Celebrex as an anti-inflammatory.  I am not recommending any more injections as those have not helped.  I would like to see him back in 6 weeks for repeat weight and BMI calculation.  Will see how the Celebrex is done.  Will see if we can also make him a referral to a weight loss clinic.  He agrees with this treatment plan.

## 2023-05-31 ENCOUNTER — Other Ambulatory Visit: Payer: Self-pay

## 2023-05-31 DIAGNOSIS — E663 Overweight: Secondary | ICD-10-CM

## 2023-07-09 ENCOUNTER — Ambulatory Visit: Payer: 59 | Admitting: Orthopaedic Surgery

## 2023-07-09 ENCOUNTER — Encounter: Payer: Self-pay | Admitting: Orthopaedic Surgery

## 2023-07-09 VITALS — Ht 77.56 in | Wt 330.0 lb

## 2023-07-09 DIAGNOSIS — G8929 Other chronic pain: Secondary | ICD-10-CM

## 2023-07-09 DIAGNOSIS — M1711 Unilateral primary osteoarthritis, right knee: Secondary | ICD-10-CM | POA: Diagnosis not present

## 2023-07-09 DIAGNOSIS — M25561 Pain in right knee: Secondary | ICD-10-CM

## 2023-07-09 NOTE — Progress Notes (Signed)
The patient is well-known to me.  He has known arthritis of his right knee.  He is also on a weight loss journey.  He has lost about 15 pounds and so saw him last and has taken some pressure off of his knee.  He does wear knee sleeve but is in need of more of a knee brace.  He has recently been on prednisone due to severe Achilles tendinitis and is considering inserts in the shoes I think this could help his knee.  Weight loss is certainly benefit his knee as well.  I did recommend Celebrex for him at his last visit but he has not picked that up yet wanting to see what else can help him.  He has also been approved for weight loss clinic.  His BMI is down to 38.57.  His right knee has slight varus malalignment that is correctable.  There is no effusion today.  Most of his pain is along medial joint line.  We will try a hinged knee brace to see if this will help give him support for that knee.  I have encouraged him to use his Celebrex if needed and continue his weight loss journey.  We are still looking at getting him into a weight loss clinic.  I would like to see him back in a month to see how he is doing overall but no x-rays are needed.  We are going to see how this brace works before considering an medial offloading osteoarthritis brace.

## 2023-08-05 LAB — HEPATIC FUNCTION PANEL
ALT: 18 U/L (ref 10–40)
AST: 16 (ref 14–40)
Alkaline Phosphatase: 76 (ref 25–125)
Bilirubin, Total: 0.3

## 2023-08-05 LAB — BASIC METABOLIC PANEL
BUN: 16 (ref 4–21)
CO2: 25 — AB (ref 13–22)
Chloride: 101 (ref 99–108)
Creatinine: 1.1 (ref 0.6–1.3)
Glucose: 95
Potassium: 4.3 meq/L (ref 3.5–5.1)
Sodium: 141 (ref 137–147)

## 2023-08-05 LAB — COMPREHENSIVE METABOLIC PANEL
A1c: 5.7
Albumin: 4.4 (ref 3.5–5.0)
Calcium: 9.3 (ref 8.7–10.7)
Globulin: 2.1
PSA, Total: 0.517
eGFR: 83

## 2023-08-05 LAB — CBC: RBC: 4.46 (ref 3.87–5.11)

## 2023-08-05 LAB — CBC AND DIFFERENTIAL
HCT: 40 — AB (ref 41–53)
Hemoglobin: 13.5 (ref 13.5–17.5)
Neutrophils Absolute: 2.39
Platelets: 175 10*3/uL (ref 150–400)
WBC: 5.1

## 2023-08-05 LAB — VITAMIN D 25 HYDROXY (VIT D DEFICIENCY, FRACTURES): Vit D, 25-Hydroxy: 36.7

## 2023-08-05 LAB — LIPID PANEL
Cholesterol: 200 (ref 0–200)
HDL: 51 (ref 35–70)
LDL Cholesterol: 120
Triglycerides: 173 — AB (ref 40–160)

## 2023-08-05 LAB — HEMOGLOBIN A1C: Hemoglobin A1C: 5.7

## 2023-08-06 ENCOUNTER — Ambulatory Visit: Payer: 59 | Admitting: Orthopaedic Surgery

## 2023-08-06 ENCOUNTER — Encounter: Payer: Self-pay | Admitting: Orthopaedic Surgery

## 2023-08-06 VITALS — Ht 77.56 in | Wt 330.0 lb

## 2023-08-06 DIAGNOSIS — M1711 Unilateral primary osteoarthritis, right knee: Secondary | ICD-10-CM | POA: Diagnosis not present

## 2023-08-06 NOTE — Progress Notes (Signed)
The patient is well-known to me.  He comes in for follow-up as it relates to chronic right knee pain and known osteoarthritis.  He has been working on weight loss.  In our office today he weighs 330 pounds but he said his scale at home weighs him last.  His BMI today is listed at 38.57.  He does have an appointment with the weight loss clinic coming up.  He says overall though he is doing well.  Examination of his right knee shows no effusion today.  He has excellent range of motion of that knee.  He did demonstrate some positions he gets and where he does experience some knee pain and occasionally he will have some symptoms as if the knee is unstable.  We talked about quad strengthening exercises and continued weight loss.  He is doing well enough overall that right now follow-up can be as needed unless things worsen.  All questions concerns were addressed and answered.

## 2023-08-08 ENCOUNTER — Ambulatory Visit (INDEPENDENT_AMBULATORY_CARE_PROVIDER_SITE_OTHER): Payer: 59 | Admitting: Family Medicine

## 2023-08-08 ENCOUNTER — Encounter (INDEPENDENT_AMBULATORY_CARE_PROVIDER_SITE_OTHER): Payer: Self-pay | Admitting: Family Medicine

## 2023-08-08 VITALS — BP 119/80 | HR 67 | Temp 98.5°F | Ht 77.5 in | Wt 324.0 lb

## 2023-08-08 DIAGNOSIS — E785 Hyperlipidemia, unspecified: Secondary | ICD-10-CM | POA: Diagnosis not present

## 2023-08-08 DIAGNOSIS — Z0289 Encounter for other administrative examinations: Secondary | ICD-10-CM

## 2023-08-08 DIAGNOSIS — E66811 Obesity, class 1: Secondary | ICD-10-CM

## 2023-08-08 DIAGNOSIS — Z6837 Body mass index (BMI) 37.0-37.9, adult: Secondary | ICD-10-CM | POA: Diagnosis not present

## 2023-08-08 DIAGNOSIS — M1711 Unilateral primary osteoarthritis, right knee: Secondary | ICD-10-CM

## 2023-08-08 DIAGNOSIS — E669 Obesity, unspecified: Secondary | ICD-10-CM

## 2023-08-08 NOTE — Progress Notes (Signed)
Sean Grippe, DO, ABFM, ABOM Bariatric physician [address] [place], [place], [place] [address] Office: [phone]  /  Fax: [phone]     Initial Evaluation:  Sean Hurst was seen in clinic today to evaluate for obesity. Sean Hurst is interested in losing weight to improve overall health and reduce the risk of weight related complications. Sean Hurst presents today to review program treatment options, initial physical assessment, and evaluation.      Sean Hurst was referred by: [name] by Dr. [name] Sean Hurst will see his PCP next week to review blood work. Sean Hurst also informed me that Sean Hurst may have possible HLD.   When asked how has your weight affected you? Sean Hurst states: Contributed to orthopedic problems or mobility issues, Having fatigue, Having poor endurance, and Problems with eating patterns  Contributing factors to her weight change: Family history and Eating patterns  Some associated conditions: Arthritis:osteoarthritis of the right knee   Current nutrition plan: Other: limiting sugary drinks switched to using stevia  Current level of physical activity: Walking and Strength training  Current or previous pharmacotherapy: None  Response to medication: Never tried medications   No medication comments found. - Celebrex 200mg  BID  History reviewed. No pertinent past medical history.   Objective:  BP 119/80   Pulse 67   Temp 98.5 F (36.9 C)   Ht 6' 5.5" (1.969 m)   Wt (!) 324 lb (147 kg)   SpO2 94%   BMI 37.93 kg/m  Sean Hurst was weighed on the bioimpedance scale: Body mass index is 37.93 kg/m.  Visceral Fat %:20, Body Fat %:35.3  Weight Lost Since Last Visit: na  Weight Gained Since Last Visit: na   Vitals Temp: 98.5 F (36.9 C) BP: 119/80 Pulse Rate: 67 SpO2: 94 %   Anthropometric Measurements Height: 6' 5.5" (1.969 m) Weight: (!) 324 lb (147 kg) BMI (Calculated): 37.91 Weight at Last Visit: na Weight Lost Since Last Visit: na Weight Gained Since Last Visit:  na Starting Weight: 324lb Total Weight Loss (lbs): 0 lb (0 kg) Peak Weight: 347lb   Body Composition  Body Fat %: 35.3 % Fat Mass (lbs): 114.6 lbs Muscle Mass (lbs): 200 lbs Total Body Water (lbs): 150.8 lbs Visceral Fat Rating : 20   Other Clinical Data Comments: info session    General: Well Developed, well nourished, and in no acute distress.  HEENT: Normocephalic, atraumatic Skin: Warm and dry, good turgor Chest:  Normal excursion, shape, no gross ABN Respiratory: no conversational dyspnea; speaking in full sentences NeuroM-Sk:  normal gross ROM * 4 extremities  Psych: A and O *3, insight adequate, mood- full    Assessment and Plan:  Obesity (BMI 30.0-34.9) BMI 37.0-37.9, adult Unilateral primary osteoarthritis, right knee Assessment: Condition is Not optimized.. Sean Hurst informed me that Sean Hurst has a history of osteoarthritis in the right knee and is hoping on receiving knee replacement surgery. His Orthopedist advised him that Sean Hurst loss weight to be able to have this procedure done to ensure that Sean Hurst is able to have this surgery without complications and hell well.   Plan: - Continue to follow-up with PCP and Orthopedist and follow any recommendations given to improve his osteoarthritis.    Hyperlipidemia, unspecified hyperlipidemia type Assessment: Sean Hurst informed me that Sean Hurst had labs done in the past and was told that Sean Hurst had an elevation in his lipid panel. Sean Hurst did not have a follow-up of this condition and is not sure if Sean Hurst truly has HLD. Sean Hurst will see his PCP  next week for labs.   Plan:- Obtain labs and discuss with PCP this condition.   - Bring in a hard copy of labs next OV.    - We discussed obesity as a disease and the importance of a more detailed evaluation of all the factors contributing to the disease.  - We reviewed weight, biometrics, associated medical conditions and contributing factors with patient. Sean Hurst would benefit from weight loss therapy via a modified calorie,  low-carb, high-protein nutritional plan tailored to their REE (resting energy expenditure) which will be determined by indirect calorimetry at their next office visit.    Multifaceted obesity treatment plan: Action Plan: - Sean Hurst was weighed on the bioimpedance scale and results were discussed and documented in the synopsis.   [name] will complete provided nutritional and psychosocial assessment questionnaire before the next appointment.  - Sean Hurst will be scheduled for indirect calorimetry to determine resting energy expenditure in a fasting state.  This will allow [place] to create a reduced calorie, high-protein meal plan to promote loss of fat mass while preserving muscle mass.  - We will also assess for cardiometabolic risk and nutritional derangements via an ECG and fasting serologies at his next appointment.  - Sean Hurst was encouraged to work on amassing support from family and friends to begin their weight loss journey.   - Work on eliminating or reducing the presence of highly processed, poorly nutritious, calorie-dense foods in the home.  Obesity Education Performed Today: - Patient was counseled on nutritional approaches to weight loss and benefits of reducing processed foods and consuming plant-based foods and high quality protein as part of nutritional weight management program.   - We discussed the importance of long term lifestyle changes which include nutrition, exercise and behavioral modifications as well as the importance of customizing this to his specific health and social needs.   - We discussed the benefits of reaching a healthier weight to alleviate the symptoms of existing conditions and reduce the risks of the biomechanical, metabolic and psychological effects of obesity.  - Was counseled on the health benefits of losing 5%-10% of total body weight.  - Was counseled on our cognitive behavorial therapy program, lead by our bariatric psychologist, who focuses on emotional eating  and creating positive behavorial change.  - Was counseled on bariatric pharmacotherapy and how this may be used as an adjunct in their weight management    Sean Hurst appears to be in the action stage of change and states they are ready to start intensive lifestyle modifications and behavioral modifications.  It was recommended that Sean Hurst follow up in the next 1-2 weeks to review the above steps, and to continue with treatment of their chronic disease state of obesity   Attestations:  - Reviewed by clinician on day of visit: allergies, medications, problem list, medical history, surgical history, family history, social history, and previous encounter notes pertinent to obesity diagnosis.  51 minutes was spent today on this visit including the above counseling, pre-visit chart review, and post-visit documentation.  Over 50% of this time was spent in direct, face-to-face counseling and coordination of care  I, Clinical biochemist, acting as a Stage manager for [employer], DO., have compiled all relevant documentation for today's office visit on behalf of Thomasene Lot, DO, while in the presence of [employer], DO.  I have reviewed the above documentation for accuracy and completeness, and I agree with the above. Sean Hurst, D.O.  The 21st Century Cures Act  was signed into law in 2016 which includes the topic of electronic health records.  This provides immediate access to information in MyChart.  This includes consultation notes, operative notes, office notes, lab results and pathology reports.  If you have any questions about what you read please let us know at your next visit so we can discuss your concerns and take corrective action if need be.  We are right here with you!

## 2023-09-05 ENCOUNTER — Ambulatory Visit (INDEPENDENT_AMBULATORY_CARE_PROVIDER_SITE_OTHER): Payer: 59 | Admitting: Family Medicine

## 2023-09-05 ENCOUNTER — Encounter (INDEPENDENT_AMBULATORY_CARE_PROVIDER_SITE_OTHER): Payer: Self-pay | Admitting: Family Medicine

## 2023-09-05 ENCOUNTER — Other Ambulatory Visit: Payer: Self-pay

## 2023-09-05 VITALS — BP 113/75 | HR 64 | Temp 97.9°F | Ht 77.5 in | Wt 330.0 lb

## 2023-09-05 DIAGNOSIS — Z6838 Body mass index (BMI) 38.0-38.9, adult: Secondary | ICD-10-CM | POA: Diagnosis not present

## 2023-09-05 DIAGNOSIS — R5383 Other fatigue: Secondary | ICD-10-CM

## 2023-09-05 DIAGNOSIS — M170 Bilateral primary osteoarthritis of knee: Secondary | ICD-10-CM

## 2023-09-05 DIAGNOSIS — E785 Hyperlipidemia, unspecified: Secondary | ICD-10-CM | POA: Diagnosis not present

## 2023-09-05 DIAGNOSIS — E65 Localized adiposity: Secondary | ICD-10-CM | POA: Insufficient documentation

## 2023-09-05 DIAGNOSIS — R0602 Shortness of breath: Secondary | ICD-10-CM | POA: Diagnosis not present

## 2023-09-05 DIAGNOSIS — Z1331 Encounter for screening for depression: Secondary | ICD-10-CM | POA: Diagnosis not present

## 2023-09-05 NOTE — Progress Notes (Signed)
Sean Hurst, D.O.  ABFM, ABOM Specializing in Clinical Bariatric Medicine Office located at: [address]  [place], [place]  [address]     Bariatric Medicine Visit  Dear Sean Putt, MD   Thank you for referring Sean Hurst to our clinic today for evaluation.  We performed a consultation to discuss his options for treatment and educate the patient on his disease state.  The following note includes my evaluation and treatment recommendations.   Please do not hesitate to reach out to me directly if you have any further concerns.    Assessment and Plan:   FOR THE DISEASE OF OBESITY: Morbid obesity (HCC)- BMI 38.0-38.9,adult Since last office visit on 08/08/2023 patient's  Muscle mass has increased by 2.2lb. Fat mass has increased by 2.8lb. Total body water has decreased by 4.4lb.  Counseling done on how various foods will affect these numbers and how to maximize success  Total lbs lost to date: 0 Total weight loss percentage to date: 0   Recommended Dietary Goals Sean Hurst is currently in the action stage of change. As such, his goal is to continue weight management plan. He has agreed to: follow the Category 3 plan - 1500 kcal per day  - I provided examples on how to find the amount of calories in foods by measurements.   - Pt will bring in a hard copy of his labs next OV  Behavioral Intervention We discussed the following Behavioral Modification Strategies today: work on meal planning and preparation, work on tracking and journaling calories using tracking application, reading food labels , keeping healthy foods at home, identifying sources and decreasing liquid calories, avoiding temptations and identifying enticing environmental cues, planning for success, and better snacking choices  Additional resources provided today: handout on CAT 3 meal plan, Handout on CAT 3-4 breakfast options, and Handout on CAT 3-4 lunch options  Evidence-based interventions for health  behavior change were utilized today including the discussion of self monitoring techniques, problem-solving barriers and SMART goal setting techniques.   Regarding patient's less desirable eating habits and patterns, we employed the technique of small changes.   Pt will specifically work on: Beginning meal plan for next visit.   Recommended Physical Activity Goals Sean Hurst has been advised to work up to 150 minutes of moderate intensity aerobic activity a week and strengthening exercises 2-3 times per week for cardiovascular health, weight loss maintenance and preservation of muscle mass.   He has agreed to :  Continue current level of physical activity    Pharmacotherapy We discussed various medication options to help Sean Hurst with his weight loss efforts and we both agreed to : continue with nutritional and behavioral strategies   FOR ASSOCIATED CONDITIONS ADDRESSED TODAY: Fatigue Assessment:  Sean Hurst does feel that his weight is causing his energy to be lower than it should be. Fatigue may be related to obesity, depression or many other causes. he does not appear to have any red flag symptoms and this appears to most likely be related to his current lifestyle habits and dietary intake.  Plan:  Labs will be ordered and reviewed with him at their next office visit in two weeks.  Epworth sleepiness scale score appears to be within normal limits.  His ESS score is 5.   Sean Hurst denies daytime somnolence and denies waking up still tired. Patient has a history of symptoms of Epworth sleepiness scale. Sean Hurst generally gets 6 or 7 hours of sleep per night, and states that he has  generally restful sleep. Snoring is present. Apneic episodes are not present.   ECG: Performed and reviewed/ interpreted independently.  Normal sinus rhythm, rate 62bpm; reassuring without any acute abnormalities, will continue to monitor for symptoms   Modified PHQ-9 Depression Screen: His Food and Mood (modified PHQ-9) score was  12.  Other fatigue -     Insulin, random -     EKG 12-Lead   Shortness of breath on exertion Assessment:  Sean Hurst does feel that he gets out of breath more easily than he used to when he exercises and seems to be worsening over time with weight gain.  This has gotten worse recently. Sean Hurst denies shortness of breath at rest or orthopnea. Sean Hurst's shortness of breath appears to be obesity related and exercise induced, as they do not appear to have any "red flag" symptoms/ concerns today.  Also, this condition appears to be related to a state of poor cardiovascular conditioning   Plan:  Obtain labs today and will be reviewed with him at their next office visit in two weeks.  SOB (shortness of breath) on exertion -     Insulin, random  Indirect Calorimeter completed today to help guide our dietary regimen. It shows a VO2 of 396 and a REE of 2736.  His calculated basal metabolic rate is 1324 thus his measured basal metabolic rate is worse than expected.  Patient agreed to work on weight loss at this time.  As Sean Hurst progresses through our weight loss program, we will gradually increase exercise as tolerated to treat his current condition.   If Sean Hurst follows our recommendations and loses 5-10% of their weight without improvement of his shortness of breath or if at any time, symptoms become more concerning, they agree to urgently follow up with their PCP/ specialist for further consideration/ evaluation.   Sean Hurst verbalizes agreement with this plan.    Visceral obesity Assessment & Plan:  Condition is Not optimized.. Current visceral fat rating: 20.  The visceral fat rating should be < 12 in a male and < 10 in a male.  Visceral adipose tissue is a hormonally active component of total body fat. This body composition phenotype is associated with medical disorders such as metabolic syndrome, cardiovascular disease and several malignancies including prostate, breast, and colorectal cancers. Starting goal: Lose  7-10% of weight via prudent nutritional plan and lifestyle changes.    Osteoarthritis of both knees, unspecified osteoarthritis type Assessment & Plan:  Condition is Not optimized.. Pt informed me that he has a hx of osteoarthritis in both knees. He notes that with excess weight gain worsens his condition.   Encouraged pt to follow-up with his PCP and any orthopedist specialist as needed.    Hyperlipidemia, unspecified hyperlipidemia type Assessment & Plan:  Condition is Not at goal.. Pt informed me that he has a history of HLD. Labs cannot be found today and he will bring in a hard copy from his PCP as he had some done recently. He is currently taking Pravachol for this condition.   Continue Pravachol 20mg  once daily as directed by prescribing provider.  Sean Hurst agrees to begin our treatment plan of a heart-heathy, low cholesterol meal plan.   FOLLOW UP:   Follow up in 2 weeks. He was informed of the importance of frequent follow up visits to maximize his success with intensive lifestyle modifications for his multiple health conditions.  Sean Hurst is aware that we will review all of his lab results at our next  visit.  He is aware that if anything is critical/ life threatening with the results, we will be contacting him via MyChart prior to the office visit to discuss management.    Chief Complaint:   OBESITY Sean Hurst (MR# [id number]) is a 47 y.o. male who presents for evaluation and treatment of obesity and related comorbidities. Current BMI is Body mass index is 38.63 kg/Hurst. Sean Hurst has been struggling with his weight for many years and has been unsuccessful in either losing weight, maintaining weight loss, or reaching his healthy weight goal.  Sean Hurst is currently in the action stage of change and ready to dedicate time achieving and maintaining a healthier weight. Sean Hurst is interested in becoming our patient and working on intensive lifestyle  modifications including (but not limited to) diet and exercise for weight loss.  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 Patient is married to Sean Hurst  and has children. He lives with wife and 2 sons.  Pt desires to be 89 within 6 months to a yr. He has a hx of smoking 1 pack for 102yrs and quit cold [place] in 2015. Pt would like to lose weight to be proud and happier about his weight. He currently does cross fit 3x a wk for 30-66mins. He feels as he started gaining excess weight 8-17yrs ago due to overeating. 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 Pt does eat out 7-9x a wk and eats fast food 2-3x a wk. He and his wife do the grocery shopping and both enjoy cooking. His only obstacle is cooking for a family of 4. He tends to crave meat, potatoes, and ice cream and craves food around 5-7p.Hurst. He dislikes liver and frequently snacks which tend to be on chips and ice cream. He sometimes snacks after dinner and occasionally skips breakfast. He notes that his wife likes to eat/cook healthy. He informed me that he did previously drink soda regularly but has stopped. He drinks coffee, milk, juice, sweet tea, smoothies, protein drinks, and alcohol 2-3 drinks about every 2 nights. He states that his worst food habit is over eating and his struggle with excessive eating/portion  control. His PCP recommended he cut back on sugar, carbs, and fatty meats. Sean Hurst tends to eat when bored and eats 3 meals a day with 1-2 snacks in between. He feels some guilt when over eating or making poor eating choices because he knows he should eat like he does. He feels out of control with his eating and has a all or nothing personality with his foods. He feels as his excess weight has effected his sleep quality, increased his fatigue, effects his mood, and decreased his interest in things he used to enjoy.   Subjective:   This is the patient's first visit at [facility].  The patient's NEW PATIENT PACKET that they filled out prior to today's office visit was reviewed at length and information from that paperwork was included within the following office visit note.    Included in the packet: current and past health history, medications, allergies, ROS, gynecologic history (women only), surgical history, family history, social history, weight history, weight loss surgery history (for those that have had weight loss surgery), nutritional evaluation, mood and food questionnaire along with a depression screening (PHQ9) on all patients, an Epworth questionnaire, sleep habits questionnaire, patient life and health improvement goals questionnaire. These will all  be scanned into the patient's chart under the "media" tab.   Review of Systems: Please refer to new patient packet scanned into media. Pertinent positives were addressed with patient today.  Reviewed by clinician on day of visit: allergies, medications, problem list, medical history, surgical history, family history, social history, and previous encounter notes.  During the visit, I independently reviewed the patient's EKG, bioimpedance scale results, and indirect calorimeter results. I used this information to tailor a meal plan for the patient that will help Sean Hurst to lose weight and will improve his obesity-related conditions going forward.  I performed a medically necessary appropriate examination and/or evaluation. I discussed the assessment and treatment plan with the patient. The patient was provided an opportunity to ask questions and all were answered. The patient agreed with the plan and demonstrated an understanding of the instructions. Labs were ordered today (unless patient declined them) and will be reviewed with the patient at our next visit unless more critical results need to be addressed immediately. Clinical information was updated and documented in the EMR.    Objective:   PHYSICAL  EXAM: Blood pressure 113/75, pulse 64, temperature 97.9 F (36.6 C), height 6' 5.5" (1.969 Hurst), weight (!) 330 lb (149.7 kg), SpO2 96%. Body mass index is 38.63 kg/Hurst.  General: Well Developed, well nourished, and in no acute distress.  HEENT: Normocephalic, atraumatic; EOMI, sclerae are anicteric. Skin: Warm and dry, good turgor Chest:  Normal excursion, shape, no gross ABN Respiratory: No conversational dyspnea; speaking in full sentences NeuroM-Sk:  Normal gross ROM * 4 extremities  Psych: A and O *3, insight adequate, mood- full   Anthropometric Measurements Height: 6' 5.5" (1.969 Hurst) Weight: (!) 330 lb (149.7 kg) BMI (Calculated): 38.61 Weight at Last Visit: na Weight Lost Since Last Visit: na Weight Gained Since Last Visit: na Starting Weight: 330lb Total Weight Loss (lbs): 0 lb (0 kg) Peak Weight: 347lb Waist Measurement : 48 inches   Body Composition  Body Fat %: 35.6 % Fat Mass (lbs): 117.4 lbs Muscle Mass (lbs): 202.2 lbs Total Body Water (lbs): 146.4 lbs Visceral Fat Rating : 20   Other Clinical Data RMR: [id number] Fasting: yes Labs: yes Today's Visit #: 1 Starting Date: 09/05/23 Comments: first visit    DIAGNOSTIC DATA REVIEWED:  BMET No results found for: "NA", "K", "CL", "CO2", "GLUCOSE", "BUN", "CREATININE", "CALCIUM", "GFRNONAA", "GFRAA" No results found for: "HGBA1C" No results found for: "INSULIN" No results found for: "TSH" CBC    Component Value Date/Time   HGB 15.0 POINT OF CARE RESULT 10/23/2007 0801   Iron Studies No results found for: "IRON", "TIBC", "FERRITIN", "IRONPCTSAT" Lipid Panel  No results found for: "CHOL", "TRIG", "HDL", "CHOLHDL", "VLDL", "LDLCALC", "LDLDIRECT" Hepatic Function Panel  No results found for: "PROT", "ALBUMIN", "AST", "ALT", "ALKPHOS", "BILITOT", "BILIDIR", "IBILI" No results found for: "TSH" Nutritional No results found for: "VD25OH"  Attestation Statements:   I, Clinical biochemist, acting as a Stage manager  for [employer], DO., have compiled all relevant documentation for today's office visit on behalf of Thomasene Lot, DO, while in the presence of [employer], DO.  Reviewed by clinician on day of visit: allergies, medications, problem list, medical history, surgical history, family history, social history, and previous encounter notes pertinent to patient's obesity diagnosis. I have spent 42 minutes in the care of the patient today including: preparing to see patient (Hurst.g. review and interpretation of tests, old notes ), obtaining and/or reviewing separately obtained history, performing a medically appropriate  examination or evaluation, counseling and educating the patient, ordering medications, test or procedures, documenting clinical information in the electronic or other health care record, and independently interpreting results and communicating results to the patient, family, or caregiver   I have reviewed the above documentation for accuracy and completeness, and I agree with the above. Sean Hurst, D.O.  The 21st Century Cures Act was signed into law in 2016 which includes the topic of electronic health records.  This provides immediate access to information in MyChart.  This includes consultation notes, operative notes, office notes, lab results and pathology reports.  If you have any questions about what you read please let us know at your next visit so we can discuss your concerns and take corrective action if need be.  We are right here with you.

## 2023-09-07 LAB — INSULIN, RANDOM: INSULIN: 12.2 u[IU]/mL (ref 2.6–24.9)

## 2023-09-19 ENCOUNTER — Ambulatory Visit (INDEPENDENT_AMBULATORY_CARE_PROVIDER_SITE_OTHER): Payer: 59 | Admitting: Family Medicine

## 2023-09-24 ENCOUNTER — Ambulatory Visit (INDEPENDENT_AMBULATORY_CARE_PROVIDER_SITE_OTHER): Payer: 59 | Admitting: Family Medicine

## 2023-10-11 ENCOUNTER — Ambulatory Visit (INDEPENDENT_AMBULATORY_CARE_PROVIDER_SITE_OTHER): Payer: 59 | Admitting: Family Medicine

## 2023-10-18 ENCOUNTER — Encounter (INDEPENDENT_AMBULATORY_CARE_PROVIDER_SITE_OTHER): Payer: Self-pay | Admitting: Family Medicine

## 2023-10-18 ENCOUNTER — Ambulatory Visit (INDEPENDENT_AMBULATORY_CARE_PROVIDER_SITE_OTHER): Payer: 59 | Admitting: Family Medicine

## 2023-10-18 VITALS — BP 110/69 | HR 64 | Temp 97.6°F | Ht 77.5 in | Wt 322.0 lb

## 2023-10-18 DIAGNOSIS — E559 Vitamin D deficiency, unspecified: Secondary | ICD-10-CM | POA: Diagnosis not present

## 2023-10-18 DIAGNOSIS — E782 Mixed hyperlipidemia: Secondary | ICD-10-CM

## 2023-10-18 DIAGNOSIS — R7303 Prediabetes: Secondary | ICD-10-CM

## 2023-10-18 DIAGNOSIS — Z6837 Body mass index (BMI) 37.0-37.9, adult: Secondary | ICD-10-CM

## 2023-10-18 MED ORDER — VITAMIN D 50 MCG (2000 UT) PO TABS: 2000.0000 [IU] | ORAL_TABLET | Freq: Every day | ORAL | Status: DC

## 2023-10-18 NOTE — Progress Notes (Signed)
Sean Hurst, DHurstO.  ABFM, ABOM Clinical Bariatric Medicine Physician  Office located at: [address] [place], [place]  [address]     Assessment and Plan:   FOR THE DISEASE OF OBESITY: Morbid obesity (HCC)-starting bmi 09/05/23-38Hurst61 BMI 37Hurst0-37Hurst9, adult - Current BMI 37Hurst67 Assessment & Plan: Since last office visit on 09/05/23 patient's muscle mass has decreased by 5Hurst4lb. Fat mass has increased by 2Hurst2lb. Total body water has increased by 1Hurst8lb.  Counseling done on how various foods will affect these numbers and how to maximize success  Total lbs lost to date: 8 lbs  Total weight loss percentage to date:  -2Hurst42%   Recommended Dietary Goals Sean Hurst is currently in the action stage of change. As such, his goal is to continue weight management plan. He has agreed to: Continue current plan (Category 3) and keep a food journal for a couple of weeks with a target of  2200-2300 calories and 150++ grams of protein daily    Behavioral Intervention We discussed the following Behavioral Modification Strategies today: increasing lean protein intake to established goals, decreasing simple carbohydrates , increasing vegetables, increasing water intake , work on tracking and journaling calories using tracking application, and continue to work on maintaining a reduced calorie state, getting the recommended amount of protein, incorporating whole foods, making healthy choices, staying well hydrated and practicing mindfulness when eating.  Additional resources provided today: Handout and personalized instruction on tracking and journaling using Apps or handwriting and using logs provided and Handout on healthy eating and balanced plate, and multiple recipes/recipe packet.  Evidence-based interventions for health behavior change were utilized today including the discussion of self monitoring techniques, problem-solving barriers and SMART goal setting techniques.   Regarding patient's less  desirable eating habits and patterns, we employed the technique of small changes.   Pt will specifically work on: start journaling for a couple of weeks to increase mindfulness for next visit.   Recommended Physical Activity Goals Sean Hurst has been advised to work up to 150 minutes of moderate intensity aerobic activity a week and strengthening exercises 2-3 times per week for cardiovascular health, weight loss maintenance and preservation of muscle mass.   He has agreed to :  Continue current level of physical activity    Pharmacotherapy We discussed various medication options to help Sean Hurst with his weight loss efforts and we both agreed to : continue with nutritional and behavioral strategies   FOR ASSOCIATED CONDITIONS ADDRESSED TODAY: Prediabetes Assessment & Plan: Lab Results  Component Value Date   HGBA1C 5Hurst7 08/05/2023   INSULIN 12Hurst2 09/05/2023   This is a new diagnosis. Pt brought printed lab results from labs obtained at his office visit with Dr. Duaine Dredge, his PCP, on 08/14/23. His A1C was elevated at 5Hurst7. Insulin labs were not drawn. He notes he had blood work drawn again earlier this month but results are still pending, states he will bring with him to his next OV.   Add journaling to meal plan (for a couple of weeks) to increase awareness to what he is eating on a daily basis. Educated pt on insulin-glucose model and how that relates to hunger/cravings. Encouraged pt to eat more lean protein to help stabilize sugars, decrease simple carb/sugars intake, and watch his portion sizing. Measure cooked proteins. Reviewed alternative high sources of proteins. Sean Hurst will continue to work on weight loss, exercise, via their meal plan we devised to help decrease the risk of progressing to diabetes.    Vitamin D  deficiency Assessment & Plan: Lab Results  Component Value Date   VD25OH 36Hurst7 08/05/2023   Vitamin D is below goal at 36Hurst7. Pt has not been taking vitamin D supplements. 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   Educated pt that vitamin D is a lipophilic vitamin. I recommend he start taking vit D supplementation at 4000 IUs daily in the winter and 2000 IUs vitamin D OTC during other seasons.   Orders: -Start Cholecalciferol 2000 IUs and 4000 IUs in the winter.    Mixed hyperlipidemia Assessment & Plan: Lab Results  Component Value Date   CHOL 200 08/05/2023   HDL 51 08/05/2023   LDLCALC 120 08/05/2023   TRIG 173 (A) 08/05/2023   Pt compliant with pravastatin 20 mg once daily. Tolerating well, no side effects. He has had good improvement with his cholesterol since starting pravastatin.   Avoid high sat/trans foods, fried foods, buttery foods, and fatty meats to improve LDL. Also avoid fatty carbs to help improve his triglycerides. Follow a heart healthy diet by following his prudent nutritional meal plan. Continue with statin therapy as directed by PCP/specialists and follow up with these individuals as instructed by them.    FOLLOW UP:   Return in about 2 weeks (around 11/01/2023). He was informed of the importance of frequent follow up visits to maximize his success with intensive lifestyle modifications for his multiple health conditions.   Subjective:   Chief complaint: Obesity Sean Hurst is here to discuss his progress with his obesity treatment plan. He is on the Category 3 Plan and states he is following his eating plan approximately 60 % of the time. He states he is exercising (cross fit) 45-60 minutes 4 days per week.  Interval History:  Sean Hurst is here today for his first follow-up office visit since starting the program with [place]. Since last office visit he is down 8 lbs. Reports some off-plan eating over the holidays. Tends to eat 3 eggs with spinach, [place] bacon, toast. Endorses increased hunger around dinner time, even when he has a snack between lunch and dinner. For dinner he has had foods such as  chicken and rice as well as vegetable pasta(made from zucchini and lentils) with [place] meatballs. Measuring cooked proteins, typically eats about 10 ounces of chicken when it doesn't have a sauce.  All blood work/ lab tests that were recently ordered by myself or an outside provider were reviewed with patient today per their request. Extended time was spent counseling him on all new disease processes that were discovered or preexisting ones that are affected by BMI.  he understands that many of these abnormalities will need to monitored regularly along with the current treatment plan of prudent dietary changes, in which we are making each and every office visit, to improve these health parameters.  Pharmacotherapy for weight loss: He is not currently taking medications  for medical weight loss.  Denies side effects.    Review of Systems:  Pertinent positives were addressed with patient today.  Reviewed by clinician on day of visit: allergies, medications, problem list, medical history, surgical history, family history, social history, and previous encounter notes.   Weight Summary and Biometrics   Weight Lost Since Last Visit: 8 lb  Weight Gained Since Last Visit: 0   Vitals Temp: 97Hurst6 F (36Hurst4 C) BP: 110/69 Pulse Rate: 64 SpO2: 98 %   Anthropometric Measurements Height: 6' 5Hurst5" (1Hurst969 m) Weight: Sean Kitchen)  322 lb (146Hurst1 kg) BMI (Calculated): 37Hurst67 Weight at Last Visit: 330 lb Weight Lost Since Last Visit: 8 lb Weight Gained Since Last Visit: 0 Starting Weight: 330 lb Total Weight Loss (lbs): 8 lb (3Hurst629 kg) Peak Weight: 347 lb   Body Composition  Body Fat %: 35Hurst8 % Fat Mass (lbs): 115Hurst2 lbs Muscle Mass (lbs): 196Hurst8 lbs Total Body Water (lbs): 148Hurst2 lbs Visceral Fat Rating : 20   Other Clinical Data Fasting: No Labs: No Today's Visit #: 2 Starting Date: 09/05/23     Objective:   PHYSICAL EXAM:  Blood pressure 110/69, pulse 64, temperature 97Hurst6 F (36Hurst4 C),  height 6' 5Hurst5" (1Hurst969 m), weight (!) 322 lb (146Hurst1 kg), SpO2 98%. Body mass index is 37Hurst69 kg/m.  General: he is overweight, cooperative and in no acute distress.   HEENT: EOMI, sclerae are anicteric. Lungs: Normal breathing effort, no conversational dyspnea. M-Sk:  Normal gross ROM * 4 extremities  PSYCH: Has normal mood, affect and thought process. Neurologic: No gross sensory or motor deficits. Well developed, A and O * 3  DIAGNOSTIC DATA REVIEWED:  BMET    Component Value Date/Time   NA 141 08/05/2023 0000   K 4Hurst3 08/05/2023 0000   CL 101 08/05/2023 0000   CO2 25 (A) 08/05/2023 0000   BUN 16 08/05/2023 0000   CREATININE 1Hurst1 08/05/2023 0000   CALCIUM 9Hurst3 08/05/2023 0000   Lab Results  Component Value Date   HGBA1C 5Hurst7 08/05/2023   Lab Results  Component Value Date   INSULIN 12Hurst2 09/05/2023   No results found for: "TSH" CBC    Component Value Date/Time   WBC 5Hurst1 08/05/2023 0000   RBC 4Hurst46 08/05/2023 0000   HGB 13Hurst5 08/05/2023 0000   HCT 40 (A) 08/05/2023 0000   PLT 175 08/05/2023 0000   Iron Studies No results found for: "IRON", "TIBC", "FERRITIN", "IRONPCTSAT" Lipid Panel     Component Value Date/Time   CHOL 200 08/05/2023 0000   TRIG 173 (A) 08/05/2023 0000   HDL 51 08/05/2023 0000   LDLCALC 120 08/05/2023 0000   Hepatic Function Panel     Component Value Date/Time   ALBUMIN 4Hurst4 08/05/2023 0000   AST 16 08/05/2023 0000   ALT 18 08/05/2023 0000   ALKPHOS 76 08/05/2023 0000   No results found for: "TSH" Nutritional Lab Results  Component Value Date   VD25OH 36Hurst7 08/05/2023    Attestations:   Reviewed by clinician on day of visit: allergies, medications, problem list, medical history, surgical history, family history, social history, and previous encounter notes pertinent to patient's obesity diagnosis.   I have spent 50 minutes in the care of the patient today including: preparing to see patient (eHurstg. review and interpretation of tests, old  notes ), obtaining and/or reviewing separately obtained history, performing a medically appropriate examination or evaluation, counseling and educating the patient, ordering medications, test or procedures, documenting clinical information in the electronic or other health care record, and independently interpreting results and communicating results to the patient, family, or caregiver   I, Isabelle Course, acting as a medical scribe for Thomasene Lot, DO., have compiled all relevant documentation for today's office visit on behalf of Thomasene Lot, DO, while in the presence of [employer], DO.  I have reviewed the above documentation for accuracy and completeness, and I agree with the above. Sean Hurst, DHurstO.  The 21st Century Cures Act was signed into law in 2016 which includes the topic of electronic health records.  This provides immediate access to information in MyChart.  This includes consultation notes, operative notes, office notes, lab results and pathology reports.  If you have any questions about what you read please let us know at your next visit so we can discuss your concerns and take corrective action if need be.  We are right here with you.

## 2023-11-06 ENCOUNTER — Encounter (INDEPENDENT_AMBULATORY_CARE_PROVIDER_SITE_OTHER): Payer: Self-pay | Admitting: Family Medicine

## 2023-11-06 ENCOUNTER — Ambulatory Visit (INDEPENDENT_AMBULATORY_CARE_PROVIDER_SITE_OTHER): Payer: 59 | Admitting: Family Medicine

## 2023-11-06 VITALS — BP 134/87 | HR 64 | Temp 97.9°F | Ht 77.5 in | Wt 321.0 lb

## 2023-11-06 DIAGNOSIS — E782 Mixed hyperlipidemia: Secondary | ICD-10-CM | POA: Diagnosis not present

## 2023-11-06 DIAGNOSIS — Z6837 Body mass index (BMI) 37.0-37.9, adult: Secondary | ICD-10-CM

## 2023-11-06 DIAGNOSIS — E559 Vitamin D deficiency, unspecified: Secondary | ICD-10-CM

## 2023-11-06 DIAGNOSIS — R7303 Prediabetes: Secondary | ICD-10-CM

## 2023-11-06 NOTE — Progress Notes (Signed)
 Sean Hurst, D.O.  ABFM, ABOM Specializing in Clinical Bariatric Medicine  Office located at: [address] [place], [place]  [address]   Assessment and Plan:   FOR THE DISEASE OF OBESITY: Morbid obesity (HCC)-starting bmi 09/05/23-38.61 BMI 37.0-37.9, adult - Current BMI 37.56 Assessment & Plan: Since last office visit on 10/03/23 patient's muscle mass has increased by 2.4lb. Fat mass has decreased by 3lb. Total body water has increased by 4.2lb.  Counseling done on how various foods will affect these numbers and how to maximize success  Total lbs lost to date: 9 lbs Total weight loss percentage to date: -2.73%    Recommended Dietary Goals Sean Hurst is currently in the action stage of change. As such, his goal is to continue weight management plan. Pt was encouraged to start journaling at his last OV on 10/18/23. Pt has not been journaling since this visit. I recommend he start journaling to increase his mindfulness, pt agrees to this.   He has agreed to: continue current plan -    Behavioral Intervention We discussed the following today: increasing lean protein intake to established goals, increasing water intake , decreasing eating out or consumption of processed foods, and making healthy choices when eating convenient foods, continue to practice mindfulness when eating, and continue to work on maintaining a reduced calorie state, getting the recommended amount of protein, incorporating whole foods, making healthy choices, staying well hydrated and practicing mindfulness when eating.  Additional resources provided today:  [employer] card [employer]   Evidence-based interventions for health behavior change were utilized today including the discussion of self monitoring techniques, problem-solving barriers and SMART goal setting techniques.   Regarding patient's less desirable eating habits and patterns, we employed the technique of small changes.   Pt will specifically work on:  n/a   Recommended Physical Activity Goals Sean Hurst has been advised to work up to 150 minutes of moderate intensity aerobic activity a week and strengthening exercises 2-3 times per week for cardiovascular health, weight loss maintenance and preservation of muscle mass.   He has agreed to :  Think about enjoyable ways to increase daily physical activity and overcoming barriers to exercise and Increase physical activity in their day and reduce sedentary time (increase NEAT).   Pharmacotherapy We discussed various medication options to help Sean Hurst with his weight loss efforts and we both agreed to: continue with nutritional and behavioral strategies   FOR ASSOCIATED CONDITIONS ADDRESSED TODAY: Prediabetes Assessment & Plan: Lab Results  Component Value Date   HGBA1C 5.7 08/05/2023   INSULIN  12.2 09/05/2023    Last A1C is at goal. He has been working on staying on plan but does report some off-plan eating. Encouraged pt to prioritize protein intake and drink more water, at least a gallon per day. Continue to exercise regularly. Will continue to monitor his condition as it pertains to his weight loss journey.    Vitamin D  deficiency Assessment & Plan: Lab Results  Component Value Date   VD25OH 36.7 08/05/2023   Last Vit D was sub-optimal at 36.7. Pt is on Cholecalciferol 2,000 IU with good compliance and tolerance. Continue with current supplementation. No changes made today. Will continue to monitor condition.    Mixed hyperlipidemia Assessment & Plan: Lab Results  Component Value Date   CHOL 200 08/05/2023   HDL 51 08/05/2023   LDLCALC 120 08/05/2023   TRIG 173 (A) 08/05/2023   Pt complains of bilateral diffuse joint aches and soreness about 1-2  months after starting Pravastatin. He has stopped taking both Zetia  and Pravastatin. He states he has been working on improving his LDL by eating healthy and exercising.   Continue to follow heart healthy diet via his prudent nutritional  meal plan as well as his exercise routine. Encouraged pt to increase his water intake by drinking at least 1 gallon per day. Pt advised to follow up with his PCP to discuss more options of cholesterol medication given his intolerances with pravastatin.    Follow up:   Return in about 2 weeks (around 11/20/2023). He was informed of the importance of frequent follow up visits to maximize his success with intensive lifestyle modifications for his multiple health conditions.  Subjective:   Chief complaint: Obesity Sean Hurst is here to discuss his progress with his obesity treatment plan. He is on the Category 3 Plan and keeping a food journal and adhering to recommended goals of 2200-2300 calories and 150+ g of  protein and states he is following his eating plan approximately 85% of the time. He states he is doing home exercises 60 minutes 3-4 days per week.  Interval History:  Sean Hurst is here for a follow up office visit. Since last OV, he is down 1 lb. States he tends to eat what his wife prepares when he has had a long day, even if it is off-plan (spaghetti/pasta). Has recently bought a carton of egg whites in an effort to increase his protein intake. Has not been journaling. Has been eating a lot of turkey lately. He reports off-plan eating on Friday when going out to [facility] with his wife and father.   Barriers identified: lack of time for self-care and having difficulty focusing on healthy eating on some days.   Pharmacotherapy for weight loss: He is currently taking no anti-obesity medication.   Review of Systems:  Pertinent positives were addressed with patient today.  Reviewed by clinician on day of visit: allergies, medications, problem list, medical history, surgical history, family history, social history, and previous encounter notes.  Weight Summary and Biometrics   Weight Lost Since Last Visit: 1lb  Weight Gained Since Last Visit: 0    Vitals Temp: 97.9 F (36.6 C) BP:  134/87 Pulse Rate: 64 SpO2: 91 %   Anthropometric Measurements Height: 6' 5.5 (1.969 m) Weight: (!) 321 lb (145.6 kg) BMI (Calculated): 37.56 Weight at Last Visit: 322lb Weight Lost Since Last Visit: 1lb Weight Gained Since Last Visit: 0 Starting Weight: 330lb Total Weight Loss (lbs): 9 lb (4.082 kg) Peak Weight: 347lb   Body Composition  Body Fat %: 34.9 % Fat Mass (lbs): 112.2 lbs Muscle Mass (lbs): 199.2 lbs Total Body Water (lbs): 152.4 lbs Visceral Fat Rating : 19   Other Clinical Data Fasting: no Labs: no Today's Visit #: 3 Starting Date: 09/05/23    Objective:   PHYSICAL EXAM: Blood pressure 134/87, pulse 64, temperature 97.9 F (36.6 C), height 6' 5.5 (1.969 m), weight (!) 321 lb (145.6 kg), SpO2 91%. Body mass index is 37.58 kg/m.  General: he is overweight, cooperative and in no acute distress. PSYCH: Has normal mood, affect and thought process.   HEENT: EOMI, sclerae are anicteric. Lungs: Normal breathing effort, no conversational dyspnea. Extremities: Moves * 4 Neurologic: A and O * 3, good insight  DIAGNOSTIC DATA REVIEWED: BMET    Component Value Date/Time   NA 141 08/05/2023 0000   K 4.3 08/05/2023 0000   CL 101 08/05/2023 0000   CO2 25 (A)  08/05/2023 0000   BUN 16 08/05/2023 0000   CREATININE 1.1 08/05/2023 0000   CALCIUM  9.3 08/05/2023 0000   Lab Results  Component Value Date   HGBA1C 5.7 08/05/2023   Lab Results  Component Value Date   INSULIN  12.2 09/05/2023   No results found for: TSH CBC    Component Value Date/Time   WBC 5.1 08/05/2023 0000   RBC 4.46 08/05/2023 0000   HGB 13.5 08/05/2023 0000   HCT 40 (A) 08/05/2023 0000   PLT 175 08/05/2023 0000   Iron Studies No results found for: IRON, TIBC, FERRITIN, IRONPCTSAT Lipid Panel     Component Value Date/Time   CHOL 200 08/05/2023 0000   TRIG 173 (A) 08/05/2023 0000   HDL 51 08/05/2023 0000   LDLCALC 120 08/05/2023 0000   Hepatic Function Panel      Component Value Date/Time   ALBUMIN 4.4 08/05/2023 0000   AST 16 08/05/2023 0000   ALT 18 08/05/2023 0000   ALKPHOS 76 08/05/2023 0000   No results found for: TSH Nutritional Lab Results  Component Value Date   VD25OH 36.7 08/05/2023    Attestations:   Sean Hurst, acting as a medical scribe for Sean Jenkins, DO., have compiled all relevant documentation for today's office visit on behalf of Sean Jenkins, DO, while in the presence of [employer], DO.  Reviewed by clinician on day of visit: allergies, medications, problem list, medical history, surgical history, family history, social history, and previous encounter notes pertinent to patient's obesity diagnosis.  I have reviewed the above documentation for accuracy and completeness, and I agree with the above. Sean JINNY Hurst, D.O.  The 21st Century Cures Act was signed into law in 2016 which includes the topic of electronic health records.  This provides immediate access to information in MyChart.  This includes consultation notes, operative notes, office notes, lab results and pathology reports.  If you have any questions about what you read please let us  know at your next visit so we can discuss your concerns and take corrective action if need be.  We are right here with you.

## 2023-11-20 ENCOUNTER — Encounter (INDEPENDENT_AMBULATORY_CARE_PROVIDER_SITE_OTHER): Payer: Self-pay | Admitting: Family Medicine

## 2023-11-20 ENCOUNTER — Ambulatory Visit (INDEPENDENT_AMBULATORY_CARE_PROVIDER_SITE_OTHER): Payer: 59 | Admitting: Family Medicine

## 2023-11-20 VITALS — BP 106/67 | HR 62 | Temp 98.4°F | Ht 77.5 in | Wt 313.0 lb

## 2023-11-20 DIAGNOSIS — E559 Vitamin D deficiency, unspecified: Secondary | ICD-10-CM | POA: Diagnosis not present

## 2023-11-20 DIAGNOSIS — Z6836 Body mass index (BMI) 36.0-36.9, adult: Secondary | ICD-10-CM | POA: Diagnosis not present

## 2023-11-20 DIAGNOSIS — R7303 Prediabetes: Secondary | ICD-10-CM | POA: Diagnosis not present

## 2023-11-20 DIAGNOSIS — Z6837 Body mass index (BMI) 37.0-37.9, adult: Secondary | ICD-10-CM

## 2023-11-20 NOTE — Progress Notes (Signed)
Sean Hurst, D.O.  ABFM, ABOM Specializing in Clinical Bariatric Medicine  Office located at: [address] [place], [place]  [address]   Assessment and Plan:   FOR THE DISEASE OF OBESITY: BMI 37.0-37.9, adult - Current BMI 36.62 Morbid obesity (HCC)-starting bmi 09/05/23-38.61 Assessment & Plan Since last office visit on 11/06/2023 patient's muscle mass has decreased by 3.4 lb. Fat mass has decreased by 4.2 lb. Total body water has decreased by 6.8 lb.  Counseling done on how various foods will affect these numbers and how to maximize success  Total lbs lost to date: 17 lbs  Total weight loss percentage to date: 5.15%    Recommended Dietary Goals Sean Hurst is currently in the action stage of change. As such, his goal is to continue weight management plan.  He has agreed to: continue current plan   Behavioral Intervention We discussed the following today: increasing lean protein intake to established goals, work on tracking and journaling calories and grams of protein, how to properly measure foods, using CHAT-GPT to find recipe ideas.   Additional resources provided today:  handout on food journaling log  Evidence-based interventions for health behavior change were utilized today including the discussion of self monitoring techniques, problem-solving barriers and SMART goal setting techniques.   Regarding patient's less desirable eating habits and patterns, we employed the technique of small changes.   Pt will : bring in journaling log   Recommended Physical Activity Goals Sean Hurst has been advised to work up to 150 minutes of moderate intensity aerobic activity a week and strengthening exercises 2-3 times per week for cardiovascular health, weight loss maintenance and preservation of muscle mass.   He has agreed to : Continue current level of physical activity    Pharmacotherapy We both agreed to : continue with nutritional and behavioral strategies   FOR ASSOCIATED  CONDITIONS ADDRESSED TODAY:  Prediabetes Assessment & Plan: Diet/exercise approach. Pt reports that chewing gum generally helps control his hungry between meals but he admits that sometimes this does not work and will still feel hungry. He does not want to start any medications for this.   Reminded patient that having adequate amounts of protein with each meal is important for increasing muscle mass, stabilizing sugars, controlling hunger and cravings, and improving thermogenesis. Continue journaling intake. Losing 10% or more of body weight may improve condition.    Vitamin D deficiency Assessment & Plan: Pt is doing well on OTC Cholecalciferol 4,000 units daily . Continue regimen. Recheck periodically.   Follow up:   Return 12/06/2023. He was informed of the importance of frequent follow up visits to maximize his success with intensive lifestyle modifications for his multiple health conditions.  Subjective:   Chief complaint: Obesity Sean Hurst is here to discuss his progress with his obesity treatment plan. He is on the Category 3 Plan and keeping a food journal and adhering to recommended goals of 2000 - 2100 calories and 150++ grams of protein  and states he is following his eating plan approximately 75-80% of the time. He states he is doing cross fit rowing and walking 20 minutes 7 days a week.   Interval History:  Sean Hurst is here for a follow up office visit. Since last OV on 11/06/2023, Sean Hurst is down 8 lbs. Pt wrote down the foods he ate but not the calories and grams of protein. He felt this activity still helped with mindfulness. Upon review of his food log, he made mostly healthy choices like  [place] bacon, grilled chicken, salad, etc. Pt reports that chewing gum generally helps control his hungry between meals but he admits that sometimes this does not work and will still feel hungry.   Pharmacotherapy for weight loss: He is currently taking no anti-obesity medication.    Review of Systems:  Pertinent positives were addressed with patient today.  Reviewed by clinician on day of visit: allergies, medications, problem list, medical history, surgical history, family history, social history, and previous encounter notes.  Weight Summary and Biometrics   Weight Lost Since Last Visit: 8 lb  No data recorded   Vitals Temp: 98.4 F (36.9 C) BP: 106/67 Pulse Rate: 62 SpO2: 98 %   Anthropometric Measurements Height: 6' 5.5" (1.969 m) Weight: (!) 313 lb (142 kg) BMI (Calculated): 36.62 Weight at Last Visit: 321 lb Weight Lost Since Last Visit: 8 lb Starting Weight: 330 lb Total Weight Loss (lbs): 17 lb (7.711 kg) Peak Weight: 347 lb   Body Composition  Body Fat %: 34.4 % Fat Mass (lbs): 108 lbs Muscle Mass (lbs): 195.8 lbs Total Body Water (lbs): 145.6 lbs Visceral Fat Rating : 19   Other Clinical Data Today's Visit #: 4 Starting Date: 09/05/23   Objective:   PHYSICAL EXAM: Blood pressure 106/67, pulse 62, temperature 98.4 F (36.9 C), height 6' 5.5" (1.969 m), weight (!) 313 lb (142 kg), SpO2 98%. Body mass index is 36.64 kg/m.  General: he is overweight, cooperative and in no acute distress. PSYCH: Has normal mood, affect and thought process.   HEENT: EOMI, sclerae are anicteric. Lungs: Normal breathing effort, no conversational dyspnea. Extremities: Moves * 4 Neurologic: A and O * 3, good insight  DIAGNOSTIC DATA REVIEWED: BMET    Component Value Date/Time   NA 141 08/05/2023 0000   K 4.3 08/05/2023 0000   CL 101 08/05/2023 0000   CO2 25 (A) 08/05/2023 0000   BUN 16 08/05/2023 0000   CREATININE 1.1 08/05/2023 0000   CALCIUM 9.3 08/05/2023 0000   Lab Results  Component Value Date   HGBA1C 5.7 08/05/2023   Lab Results  Component Value Date   INSULIN 12.2 09/05/2023   No results found for: "TSH" CBC    Component Value Date/Time   WBC 5.1 08/05/2023 0000   RBC 4.46 08/05/2023 0000   HGB 13.5 08/05/2023 0000    HCT 40 (A) 08/05/2023 0000   PLT 175 08/05/2023 0000   Iron Studies No results found for: "IRON", "TIBC", "FERRITIN", "IRONPCTSAT" Lipid Panel     Component Value Date/Time   CHOL 200 08/05/2023 0000   TRIG 173 (A) 08/05/2023 0000   HDL 51 08/05/2023 0000   LDLCALC 120 08/05/2023 0000   Hepatic Function Panel     Component Value Date/Time   ALBUMIN 4.4 08/05/2023 0000   AST 16 08/05/2023 0000   ALT 18 08/05/2023 0000   ALKPHOS 76 08/05/2023 0000   No results found for: "TSH" Nutritional Lab Results  Component Value Date   VD25OH 36.7 08/05/2023    Attestations:   I, Special Puri, acting as a Stage manager for [employer], DO., have compiled all relevant documentation for today's office visit on behalf of Thomasene Lot, DO, while in the presence of [employer], DO.  Reviewed by clinician on day of visit: allergies, medications, problem list, medical history, surgical history, family history, social history, and previous encounter notes pertinent to patient's obesity diagnosis. I have spent 27 minutes in the care of the patient today including: preparing to  see patient (e.g. review and interpretation of tests, old notes ), obtaining and/or reviewing separately obtained history, performing a medically appropriate examination or evaluation, counseling and educating the patient, ordering medications, test or procedures, documenting clinical information in the electronic or other health care record, and independently interpreting results and communicating results to the patient, family, or caregiver   I have reviewed the above documentation for accuracy and completeness, and I agree with the above. Sean Hurst, D.O.  The 21st Century Cures Act was signed into law in 2016 which includes the topic of electronic health records.  This provides immediate access to information in MyChart.  This includes consultation notes, operative notes, office notes, lab results and  pathology reports.  If you have any questions about what you read please let us know at your next visit so we can discuss your concerns and take corrective action if need be.  We are right here with you.

## 2023-12-06 ENCOUNTER — Ambulatory Visit (INDEPENDENT_AMBULATORY_CARE_PROVIDER_SITE_OTHER): Payer: 59 | Admitting: Family Medicine

## 2023-12-06 ENCOUNTER — Encounter (INDEPENDENT_AMBULATORY_CARE_PROVIDER_SITE_OTHER): Payer: Self-pay | Admitting: Family Medicine

## 2023-12-06 VITALS — BP 122/71 | HR 54 | Temp 97.4°F | Ht 77.5 in | Wt 306.0 lb

## 2023-12-06 DIAGNOSIS — Z6835 Body mass index (BMI) 35.0-35.9, adult: Secondary | ICD-10-CM | POA: Diagnosis not present

## 2023-12-06 DIAGNOSIS — R7303 Prediabetes: Secondary | ICD-10-CM | POA: Diagnosis not present

## 2023-12-06 DIAGNOSIS — E559 Vitamin D deficiency, unspecified: Secondary | ICD-10-CM

## 2023-12-06 DIAGNOSIS — E65 Localized adiposity: Secondary | ICD-10-CM

## 2023-12-06 DIAGNOSIS — Z6837 Body mass index (BMI) 37.0-37.9, adult: Secondary | ICD-10-CM

## 2023-12-06 NOTE — Progress Notes (Signed)
 Sean Hurst, D.O.  ABFM, ABOM Specializing in Clinical Bariatric Medicine  Office located at: [address] [place], [place]  [address]   Assessment and Plan:   FOR THE DISEASE OF OBESITY:  BMI 37.0-37.9, adult - Current BMI 35.8 Morbid obesity (HCC)-starting bmi 09/05/23-38.61 Assessment & Plan: Since last office visit on 11/20/2023 patient's  Muscle mass has decreased by 3.2 lb. Fat mass has decreased by 3.8 lb. Total body water has decreased by 2 lb.  Counseling done on how various foods will affect these numbers and how to maximize success  Total lbs lost to date: 24 lbs  Total weight loss percentage to date: 7.27%    Recommended Dietary Goals Sean Hurst is currently in the action stage of change. As such, his goal is to continue weight management plan.  We modified his journaling parameters to 2000-2100 calories and 130-150 grams protein daily.   Behavioral Intervention We discussed the following today: non-starchy vegetables, sources of healthy fats, and values from pt's journaling log.   Additional resources provided today:  handout on Adaptive Thermogenesis, handout on non-starchy vegetables.   Evidence-based interventions for health behavior change were utilized today including the discussion of self monitoring techniques, problem-solving barriers and SMART goal setting techniques.   Regarding patient's less desirable eating habits and patterns, we employed the technique of small changes.   Pt will specifically work on: n/a   Recommended Physical Activity Goals Sean Hurst has been advised to work up to 150 minutes of moderate intensity aerobic activity a week and strengthening exercises 2-3 times per week for cardiovascular health, weight loss maintenance and preservation of muscle mass.   He has agreed to : do less reps, but higher weights while exercising   Pharmacotherapy N/a  FOR ASSOCIATED CONDITIONS ADDRESSED TODAY:   Prediabetes Assessment &  Plan: Diet/exercise approach. No c/o hunger/cravings. Continue journaling intake focusing on protein, fruits, and vegetables while limiting simple carbohydrates.    Visceral obesity Assessment & Plan: Current visceral fat rating: 18.  The visceral fat rating should be < 10 in a male.   Visceral adipose tissue is a hormonally active component of total body fat. This body composition phenotype is associated with medical disorders such as metabolic syndrome, cardiovascular disease and several malignancies including prostate, breast, and colorectal cancers. Goal: Lose 7-10% of weight via prudent nutritional plan and lifestyle changes.     Vitamin D deficiency Assessment & Plan: Pt is on OTC Cholecalciferol 4,000 lU daily. Continue vitamin D supplementation - recheck levels in the future.   Follow up:   Return 12/20/2023. He was informed of the importance of frequent follow up visits to maximize his success with intensive lifestyle modifications for his multiple health conditions.  Subjective:   Chief complaint: Obesity Sean Hurst is here to discuss his progress with his obesity treatment plan. He is on the Category 3 Plan and keeping a food journal and adhering to recommended goals of 2000 - 2100 calories and 150++ grams of protein and states he is following his eating plan approximately 90% of the time. He states he is doing cross-fit 60-120 minutes, 4 days a week.   Interval History:  Sean Hurst is here for a follow up office visit. Since last OV on 11/20/2023, Sean Hurst is down 7 lbs. He does not enjoy journaling but acknowledges it helps with mindfulness. Lowest cal: 1363, Highest Cal: 2358, Lowest protein: 55.2 grams, Highest protein: 273 grams. He often eats large quantities of protein (e.g 16 ounces) in  one meal/sitting.   Pharmacotherapy for weight loss: He is currently taking no anti-obesity medication.   Review of Systems:  Pertinent positives were addressed with patient  today.  Reviewed by clinician on day of visit: allergies, medications, problem list, medical history, surgical history, family history, social history, and previous encounter notes.  Weight Summary and Biometrics   Weight Lost Since Last Visit: 7 lb  Weight Gained Since Last Visit: 0   Vitals Temp: (!) 97.4 F (36.3 C) BP: 122/71 Pulse Rate: (!) 54 SpO2: 99 %   Anthropometric Measurements Height: 6' 5.5" (1.969 m) Weight: (!) 306 lb (138.8 kg) BMI (Calculated): 35.8 Weight at Last Visit: 313 lb Weight Lost Since Last Visit: 7 lb Weight Gained Since Last Visit: 0 Starting Weight: 330 lb Total Weight Loss (lbs): 24 lb (10.9 kg) Peak Weight: 347 lb   Body Composition  Body Fat %: 34 % Fat Mass (lbs): 104.2 lbs Muscle Mass (lbs): 192.6 lbs Total Body Water (lbs): 143.6 lbs Visceral Fat Rating : 18   Other Clinical Data Fasting: No Labs: No Today's Visit #: 5 Starting Date: 09/05/23   Objective:   PHYSICAL EXAM: Blood pressure 122/71, pulse (!) 54, temperature (!) 97.4 F (36.3 C), height 6' 5.5" (1.969 m), weight (!) 306 lb (138.8 kg), SpO2 99%. Body mass index is 35.82 kg/m.  General: he is overweight, cooperative and in no acute distress. PSYCH: Has normal mood, affect and thought process.   HEENT: EOMI, sclerae are anicteric. Lungs: Normal breathing effort, no conversational dyspnea. Extremities: Moves * 4 Neurologic: A and O * 3, good insight  DIAGNOSTIC DATA REVIEWED: BMET    Component Value Date/Time   NA 141 08/05/2023 0000   K 4.3 08/05/2023 0000   CL 101 08/05/2023 0000   CO2 25 (A) 08/05/2023 0000   BUN 16 08/05/2023 0000   CREATININE 1.1 08/05/2023 0000   CALCIUM 9.3 08/05/2023 0000   Lab Results  Component Value Date   HGBA1C 5.7 08/05/2023   Lab Results  Component Value Date   INSULIN 12.2 09/05/2023   No results found for: "TSH" CBC    Component Value Date/Time   WBC 5.1 08/05/2023 0000   RBC 4.46 08/05/2023 0000   HGB  13.5 08/05/2023 0000   HCT 40 (A) 08/05/2023 0000   PLT 175 08/05/2023 0000   Iron Studies No results found for: "IRON", "TIBC", "FERRITIN", "IRONPCTSAT" Lipid Panel     Component Value Date/Time   CHOL 200 08/05/2023 0000   TRIG 173 (A) 08/05/2023 0000   HDL 51 08/05/2023 0000   LDLCALC 120 08/05/2023 0000   Hepatic Function Panel     Component Value Date/Time   ALBUMIN 4.4 08/05/2023 0000   AST 16 08/05/2023 0000   ALT 18 08/05/2023 0000   ALKPHOS 76 08/05/2023 0000   No results found for: "TSH" Nutritional Lab Results  Component Value Date   VD25OH 36.7 08/05/2023    Attestations:   I, Special Puri, acting as a Stage manager for [employer], DO., have compiled all relevant documentation for today's office visit on behalf of Thomasene Lot, DO, while in the presence of [employer], DO.  I have reviewed the above documentation for accuracy and completeness, and I agree with the above. Sean Hurst, D.O.  The 21st Century Cures Act was signed into law in 2016 which includes the topic of electronic health records.  This provides immediate access to information in MyChart.  This includes consultation notes, operative notes,  office notes, lab results and pathology reports.  If you have any questions about what you read please let us know at your next visit so we can discuss your concerns and take corrective action if need be.  We are right here with you.

## 2023-12-20 ENCOUNTER — Encounter (INDEPENDENT_AMBULATORY_CARE_PROVIDER_SITE_OTHER): Payer: Self-pay | Admitting: Family Medicine

## 2023-12-20 ENCOUNTER — Ambulatory Visit (INDEPENDENT_AMBULATORY_CARE_PROVIDER_SITE_OTHER): Payer: 59 | Admitting: Family Medicine

## 2023-12-20 DIAGNOSIS — E559 Vitamin D deficiency, unspecified: Secondary | ICD-10-CM | POA: Diagnosis not present

## 2023-12-20 DIAGNOSIS — R7303 Prediabetes: Secondary | ICD-10-CM

## 2023-12-20 DIAGNOSIS — E782 Mixed hyperlipidemia: Secondary | ICD-10-CM | POA: Diagnosis not present

## 2023-12-20 DIAGNOSIS — Z6835 Body mass index (BMI) 35.0-35.9, adult: Secondary | ICD-10-CM

## 2023-12-20 MED ORDER — VITAMIN D 50 MCG (2000 UT) PO TABS: 2000.0000 [IU] | ORAL_TABLET | Freq: Every day | ORAL | Status: DC

## 2023-12-20 NOTE — Progress Notes (Signed)
 Sean Hurst, D.O.  ABFM, ABOM Specializing in Clinical Bariatric Medicine  Office located at: [address] [place], [place]  [address]   Assessment and Plan:   Orders Placed This Encounter  Procedures   VITAMIN D 25 Hydroxy (Vit-D Deficiency, Fractures)   Hemoglobin A1c    Medications Discontinued During This Encounter  Medication Reason   Cholecalciferol (VITAMIN D) 50 MCG (2000 UT) tablet Reorder     Meds ordered this encounter  Medications   Cholecalciferol (VITAMIN D) 50 MCG (2000 UT) tablet    Sig: Take 1 tablet (2,000 Units total) by mouth daily. 4,000 IU daily in winter      Labs obtained today (A1c and Vit D) will be reviewed at next OV.   FOR THE DISEASE OF OBESITY: Morbid obesity (HCC)-starting bmi 09/05/23-38.61 BMI 35.0-35.9,adult - Current BMI 35.8 Assessment & Plan: Since last office visit on 12/06/23 patient's muscle mass has decreased by 0.4 lb. Fat mass has increased by 0.2 lb. Total body water has increased by 6 lb.  Counseling done on how various foods will affect these numbers and how to maximize success  Total lbs lost to date: 24 lbs  Total weight loss percentage to date: -7.27%   Recommended Dietary Goals Sean Hurst is currently in the action stage of change. As such, his goal is to continue weight management plan.  He has agreed to: continue current plan   Behavioral Intervention We discussed the following today: increasing lean protein intake to established goals, keeping healthy foods at home, and decreasing eating out or consumption of processed foods, and making healthy choices when eating convenient foods  Additional resources provided today:  Handout on Metformin  Evidence-based interventions for health behavior change were utilized today including the discussion of self monitoring techniques, problem-solving barriers and SMART goal setting techniques.   Regarding patient's less desirable eating habits and patterns, we employed the  technique of small changes.   Pt will specifically work on: Focus on cutting back on fatty meats and snacking for next visit.    Recommended Physical Activity Goals Sean Hurst has been advised to work up to 150 minutes of moderate intensity aerobic activity a week and strengthening exercises 2-3 times per week for cardiovascular health, weight loss maintenance and preservation of muscle mass.   He has agreed to :  Continue current level of physical activity    Pharmacotherapy We both agreed to: continue with nutritional and behavioral strategies and has declined pharmacotherapy   FOR ASSOCIATED CONDITIONS ADDRESSED TODAY:  Prediabetes Assessment & Plan: Lab Results  Component Value Date   HGBA1C 5.7 08/05/2023   INSULIN 12.2 09/05/2023    No meds currently. Diet/exercise approach. Endorses some hunger and cravings. He reports eating off-plan, fatty meats last week. Discussed the options of starting Metformin for better control of hunger and cravings as well as A1c and insulin management. Pt would like to first discuss his options with his wife prior to starting any medications today. Educated pt on the benefits, risks, and potential side effects of Metformin. A handout was given to patient and all questions were addressed. Will continue to monitor condition and will recheck A1c today. Results will be reviewed at his next OV.   Orders: - Recheck A1c today   Mixed hyperlipidemia Assessment & Plan: Lab Results  Component Value Date   CHOL 200 08/05/2023   HDL 51 08/05/2023   LDLCALC 120 08/05/2023   TRIG 173 (A) 08/05/2023   Per the results of  his most recent lipid panel on 08/05/2023 , his LDL was above goal at 120 and his triglycerides were elevated at 173. Pt is on Lipitor 10 mg once daily and Zetia 10 mg once daily with good compliance and tolerances. No adverse side effects.   Reviewed ideal LDL of less than 100 and triglycerides of less than 150. Avoid any fatty meats or butters  and decrease simple carbs/ sugars; follow his meal plan. Will continue to monitor condition as it relates to his weight loss journey.    Vitamin D deficiency Assessment & Plan: Lab Results  Component Value Date   VD25OH 36.7 08/05/2023   Last vitamin was below goal at 36.7. Pt is taking Vitamin D 2,000 units once daily. Tolerating well with no adverse SE reported. No acute concerns today.   Ideal vitamin D of 50-70 reviewed with pt. Continue with current supplementation as directed. No dose changes today. Will continue monitoring condition and will recheck vitamin D levels today. Results will be reviewed at his next OV.   Orders: - Refill Vitamin D  - Recheck Vitamin D    Follow up:   Return in about 2 weeks (around 01/03/2024) for  2 week follow up. , Keep upcoming appt, make another appt. He was informed of the importance of frequent follow up visits to maximize his success with intensive lifestyle modifications for his multiple health conditions.  Subjective:   Chief complaint: Obesity Sean Hurst is here to discuss his progress with his obesity treatment plan. He is on the Category 3 Plan and keeping a food journal and adhering to recommended goals of 2000-2100 calories and 130-150 g of protein and states he is following his eating plan approximately 60% of the time. He states he is counting steps and exercising 60 minutes 4-5 days per week.  Interval History:  Sean Hurst is here for a follow up office visit. Since last OV on 12/06/23, he has maintained his weight; no weight lost/gained. States he struggled to stay on plan last week. Reports eating several shrimp dishes and rib eyes.   Pharmacotherapy for weight loss: He is currently taking no anti-obesity medication.   Review of Systems:  Pertinent positives were addressed with patient today.  Reviewed by clinician on day of visit: allergies, medications, problem list, medical history, surgical history, family history, social history,  and previous encounter notes.  Weight Summary and Biometrics   Weight Lost Since Last Visit: 0  Weight Gained Since Last Visit: 0    Vitals Temp: 97.6 F (36.4 C) BP: 116/72 Pulse Rate: (!) 59 SpO2: 98 %   Anthropometric Measurements Height: 6' 5.5" (1.969 m) Weight: (!) 306 lb (138.8 kg) BMI (Calculated): 35.8 Weight at Last Visit: 306 lb Weight Lost Since Last Visit: 0 Weight Gained Since Last Visit: 0 Starting Weight: 330 lb Total Weight Loss (lbs): 24 lb (10.9 kg) Peak Weight: 347 lb   Body Composition  Body Fat %: 34.1 % Fat Mass (lbs): 104.4 lbs Muscle Mass (lbs): 192.2 lbs Total Body Water (lbs): 149.6 lbs Visceral Fat Rating : 18   Other Clinical Data Fasting: No Labs: No Today's Visit #: 6 Starting Date: 09/05/23    Objective:   PHYSICAL EXAM: Blood pressure 116/72, pulse (!) 59, temperature 97.6 F (36.4 C), height 6' 5.5" (1.969 m), weight (!) 306 lb (138.8 kg), SpO2 98%. Body mass index is 35.82 kg/m.  General: he is overweight, cooperative and in no acute distress. PSYCH: Has normal mood, affect and thought  process.   HEENT: EOMI, sclerae are anicteric. Lungs: Normal breathing effort, no conversational dyspnea. Extremities: Moves * 4 Neurologic: A and O * 3, good insight  DIAGNOSTIC DATA REVIEWED: BMET    Component Value Date/Time   NA 141 08/05/2023 0000   K 4.3 08/05/2023 0000   CL 101 08/05/2023 0000   CO2 25 (A) 08/05/2023 0000   BUN 16 08/05/2023 0000   CREATININE 1.1 08/05/2023 0000   CALCIUM 9.3 08/05/2023 0000   Lab Results  Component Value Date   HGBA1C 5.7 08/05/2023   Lab Results  Component Value Date   INSULIN 12.2 09/05/2023   No results found for: "TSH" CBC    Component Value Date/Time   WBC 5.1 08/05/2023 0000   RBC 4.46 08/05/2023 0000   HGB 13.5 08/05/2023 0000   HCT 40 (A) 08/05/2023 0000   PLT 175 08/05/2023 0000   Iron Studies No results found for: "IRON", "TIBC", "FERRITIN",  "IRONPCTSAT" Lipid Panel     Component Value Date/Time   CHOL 200 08/05/2023 0000   TRIG 173 (A) 08/05/2023 0000   HDL 51 08/05/2023 0000   LDLCALC 120 08/05/2023 0000   Hepatic Function Panel     Component Value Date/Time   ALBUMIN 4.4 08/05/2023 0000   AST 16 08/05/2023 0000   ALT 18 08/05/2023 0000   ALKPHOS 76 08/05/2023 0000   No results found for: "TSH" Nutritional Lab Results  Component Value Date   VD25OH 36.7 08/05/2023    Attestations:   Burnett Sheng, acting as a medical scribe for Thomasene Lot, DO., have compiled all relevant documentation for today's office visit on behalf of Thomasene Lot, DO, while in the presence of [employer], DO.  Reviewed by clinician on day of visit: allergies, medications, problem list, medical history, surgical history, family history, social history, and previous encounter notes pertinent to patient's obesity diagnosis.  I have reviewed the above documentation for accuracy and completeness, and I agree with the above. Sean Hurst, D.O.  The 21st Century Cures Act was signed into law in 2016 which includes the topic of electronic health records.  This provides immediate access to information in MyChart.  This includes consultation notes, operative notes, office notes, lab results and pathology reports.  If you have any questions about what you read please let us know at your next visit so we can discuss your concerns and take corrective action if need be.  We are right here with you.

## 2023-12-21 LAB — HEMOGLOBIN A1C
Est. average glucose Bld gHb Est-mCnc: 111 mg/dL
Hgb A1c MFr Bld: 5.5 % (ref 4.8–5.6)

## 2023-12-21 LAB — VITAMIN D 25 HYDROXY (VIT D DEFICIENCY, FRACTURES): Vit D, 25-Hydroxy: 55.3 ng/mL (ref 30.0–100.0)

## 2024-01-03 ENCOUNTER — Encounter (INDEPENDENT_AMBULATORY_CARE_PROVIDER_SITE_OTHER): Payer: Self-pay | Admitting: Family Medicine

## 2024-01-03 ENCOUNTER — Ambulatory Visit (INDEPENDENT_AMBULATORY_CARE_PROVIDER_SITE_OTHER): Payer: 59 | Admitting: Family Medicine

## 2024-01-03 VITALS — BP 115/67 | HR 72 | Temp 97.5°F | Ht 77.5 in | Wt 295.0 lb

## 2024-01-03 DIAGNOSIS — R7303 Prediabetes: Secondary | ICD-10-CM

## 2024-01-03 DIAGNOSIS — Z6834 Body mass index (BMI) 34.0-34.9, adult: Secondary | ICD-10-CM

## 2024-01-03 DIAGNOSIS — E66811 Obesity, class 1: Secondary | ICD-10-CM

## 2024-01-03 DIAGNOSIS — E669 Obesity, unspecified: Secondary | ICD-10-CM

## 2024-01-03 DIAGNOSIS — E559 Vitamin D deficiency, unspecified: Secondary | ICD-10-CM | POA: Diagnosis not present

## 2024-01-03 MED ORDER — VITAMIN D 50 MCG (2000 UT) PO TABS
2000.0000 [IU] | ORAL_TABLET | Freq: Every day | ORAL | Status: AC
Start: 2024-01-03 — End: ?

## 2024-01-03 NOTE — Progress Notes (Signed)
 Sean Hurst, D.O.  ABFM, ABOM Specializing in Clinical Bariatric Medicine  Office located at: [address] [place], [place]  [address]   Assessment and Plan:   Medications Discontinued During This Encounter  Medication Reason   indomethacin (INDOCIN) 50 MG capsule Completed Course   Cholecalciferol (VITAMIN D) 50 MCG (2000 UT) tablet Reorder    Meds ordered this encounter  Medications   Cholecalciferol (VITAMIN D) 50 MCG (2000 UT) tablet    Sig: Take 1 tablet (2,000 Units total) by mouth daily. 4,000 IU daily in winter    FOR THE DISEASE OF OBESITY: BMI 34.0-34.9,adult -- Current BMI 34.51 Obesity (BMI 30.0-34.9) Assessment & Plan: Since last office visit on 12/20/23 patient's muscle mass has decreased by 1.8 lb. Fat mass has decreased by 9.4 lb. Total body water has decreased by 11.2 lb.  Counseling done on how various foods will affect these numbers and how to maximize success  Total lbs lost to date: 35 lbs Total weight loss percentage to date: -10.1%   Recommended Dietary Goals Sean Hurst is currently in the action stage of change. As such, his goal is to continue weight management plan.  He has agreed to: continue current plan   Behavioral Intervention We discussed the following today: increasing water intake  and continue to work on maintaining a reduced calorie state, getting the recommended amount of protein, incorporating whole foods, making healthy choices, staying well hydrated and practicing mindfulness when eating.  Additional resources provided today: None  Evidence-based interventions for health behavior change were utilized today including the discussion of self monitoring techniques, problem-solving barriers and SMART goal setting techniques.   Regarding patient's less desirable eating habits and patterns, we employed the technique of small changes.   Pt will specifically work on: n/a   Recommended Physical Activity Goals Sean Hurst has been advised to  work up to 150 minutes of moderate intensity aerobic activity a week and strengthening exercises 2-3 times per week for cardiovascular health, weight loss maintenance and preservation of muscle mass.   He has agreed to :  Continue current level of physical activity    Pharmacotherapy We both agreed to: continue with nutritional and behavioral strategies   FOR ASSOCIATED CONDITIONS ADDRESSED TODAY: Prediabetes Assessment & Plan: Lab Results  Component Value Date   HGBA1C 5.5 12/20/2023   HGBA1C 5.7 08/05/2023   INSULIN 12.2 09/05/2023    Last obtained A1c was at goal at 5.5 on 12/20/23. Not currently on any meds. Diet/exercise controlled. Hunger and cravings are well controlled. Pt drinks about 1 gallon of water per day.  Continue efforts towards weight loss by following his nutritional meal plan and exercise regimen. Encouraged pt to continue with current daily water intake, and increase amount when the weather gets hotter, given pt works outside. Will continue to monitor condition as it relates to his weight loss.    Vitamin D deficiency Assessment & Plan: Lab Results  Component Value Date   VD25OH 55.3 12/20/2023   VD25OH 36.7 08/05/2023   Vitamin D levels are at goal at 55.3 as of 12/20/23. Pt is currently taking 4,000 IUs of Cholecalciferol daily. Tolerating well with no reported adverse side effects.   Reviewed ideal vitamin D levels of 50-70 with patient. Continue with current supplementation regimen as directed, no dose changes made today. Will consider reducing vitamin D dose to 2,000 when the weather is warmer outside given pt works outdoors. Will continue to monitor condition and recheck his vitamin D levels  in the next 3-4 months since last checked.   Orders: - Refill Cholecalciferol  Follow up:   Return in about 20 days (around 01/23/2024) for 2 week follow up. He was informed of the importance of frequent follow up visits to maximize his success with intensive  lifestyle modifications for his multiple health conditions.  Subjective:   Chief complaint: Obesity Sean Hurst is here to discuss his progress with his obesity treatment plan. He is on the Category 3 Plan and keeping a food journal and adhering to recommended goals of 2000-2100 calories and 130-150 g of  protein and states he is following his eating plan approximately 65-70% of the time. He states he is doing cross fit 60 minutes 3-4 days per week.   Interval History:  Sean Hurst is here for a follow up office visit. Since last OV on 12/20/23,  he is down 11 lbs. He has reduced his eating seconds or thirds when hungry after his first serving. He also cut back on fatty meats and snacking. On average he drinks about 1 gallon of water per day.   Pharmacotherapy for weight loss: He is currently taking no anti-obesity medication.   Review of Systems:  Pertinent positives were addressed with patient today.  Reviewed by clinician on day of visit: allergies, medications, problem list, medical history, surgical history, family history, social history, and previous encounter notes.  Weight Summary and Biometrics   Weight Lost Since Last Visit: 11 lb  Weight Gained Since Last Visit: 0   Vitals Temp: (!) 97.5 F (36.4 C) BP: 115/67 Pulse Rate: 72 SpO2: 98 %   Anthropometric Measurements Height: 6' 5.5" (1.969 m) Weight: 295 lb (133.8 kg) BMI (Calculated): 34.51 Weight at Last Visit: 306 lb Weight Lost Since Last Visit: 11 lb Weight Gained Since Last Visit: 0 Starting Weight: 330 lb Total Weight Loss (lbs): 35 lb (15.9 kg) Peak Weight: 347 lb   Body Composition  Body Fat %: 32.2 % Fat Mass (lbs): 95 lbs Muscle Mass (lbs): 190.4 lbs Total Body Water (lbs): 138.4 lbs Visceral Fat Rating : 17   Other Clinical Data Fasting: No Labs: No Today's Visit #: 7 Starting Date: 09/05/23    Objective:   PHYSICAL EXAM: Blood pressure 115/67, pulse 72, temperature (!) 97.5 F (36.4 C),  height 6' 5.5" (1.969 m), weight 295 lb (133.8 kg), SpO2 98%. Body mass index is 34.53 kg/m.  General: he is overweight, cooperative and in no acute distress. PSYCH: Has normal mood, affect and thought process.   HEENT: EOMI, sclerae are anicteric. Lungs: Normal breathing effort, no conversational dyspnea. Extremities: Moves * 4 Neurologic: A and O * 3, good insight  DIAGNOSTIC DATA REVIEWED: BMET    Component Value Date/Time   NA 141 08/05/2023 0000   K 4.3 08/05/2023 0000   CL 101 08/05/2023 0000   CO2 25 (A) 08/05/2023 0000   BUN 16 08/05/2023 0000   CREATININE 1.1 08/05/2023 0000   CALCIUM 9.3 08/05/2023 0000   Lab Results  Component Value Date   HGBA1C 5.5 12/20/2023   HGBA1C 5.7 08/05/2023   Lab Results  Component Value Date   INSULIN 12.2 09/05/2023   No results found for: "TSH" CBC    Component Value Date/Time   WBC 5.1 08/05/2023 0000   RBC 4.46 08/05/2023 0000   HGB 13.5 08/05/2023 0000   HCT 40 (A) 08/05/2023 0000   PLT 175 08/05/2023 0000   Iron Studies No results found for: "IRON", "TIBC", "FERRITIN", "IRONPCTSAT"  Lipid Panel     Component Value Date/Time   CHOL 200 08/05/2023 0000   TRIG 173 (A) 08/05/2023 0000   HDL 51 08/05/2023 0000   LDLCALC 120 08/05/2023 0000   Hepatic Function Panel     Component Value Date/Time   ALBUMIN 4.4 08/05/2023 0000   AST 16 08/05/2023 0000   ALT 18 08/05/2023 0000   ALKPHOS 76 08/05/2023 0000   No results found for: "TSH" Nutritional Lab Results  Component Value Date   VD25OH 55.3 12/20/2023   VD25OH 36.7 08/05/2023    Attestations:   I, Isabelle Course, acting as a medical scribe for Thomasene Lot, DO., have compiled all relevant documentation for today's office visit on behalf of Thomasene Lot, DO, while in the presence of [employer], DO.  Reviewed by clinician on day of visit: allergies, medications, problem list, medical history, surgical history, family history, social history, and  previous encounter notes pertinent to patient's obesity diagnosis.  I have reviewed the above documentation for accuracy and completeness, and I agree with the above. Sean Hurst, D.O.  The 21st Century Cures Act was signed into law in 2016 which includes the topic of electronic health records.  This provides immediate access to information in MyChart.  This includes consultation notes, operative notes, office notes, lab results and pathology reports.  If you have any questions about what you read please let us know at your next visit so we can discuss your concerns and take corrective action if need be.  We are right here with you.

## 2024-01-07 ENCOUNTER — Encounter: Admitting: Orthopaedic Surgery

## 2024-01-17 ENCOUNTER — Ambulatory Visit (INDEPENDENT_AMBULATORY_CARE_PROVIDER_SITE_OTHER): Payer: 59 | Admitting: Family Medicine

## 2024-01-23 ENCOUNTER — Encounter (INDEPENDENT_AMBULATORY_CARE_PROVIDER_SITE_OTHER): Payer: Self-pay | Admitting: Family Medicine

## 2024-01-23 ENCOUNTER — Ambulatory Visit (INDEPENDENT_AMBULATORY_CARE_PROVIDER_SITE_OTHER): Admitting: Family Medicine

## 2024-01-23 VITALS — BP 110/70 | HR 63 | Temp 98.1°F | Ht 77.5 in | Wt 299.0 lb

## 2024-01-23 DIAGNOSIS — E559 Vitamin D deficiency, unspecified: Secondary | ICD-10-CM | POA: Diagnosis not present

## 2024-01-23 DIAGNOSIS — S62350D Nondisplaced fracture of shaft of second metacarpal bone, right hand, subsequent encounter for fracture with routine healing: Secondary | ICD-10-CM | POA: Diagnosis not present

## 2024-01-23 DIAGNOSIS — Z6834 Body mass index (BMI) 34.0-34.9, adult: Secondary | ICD-10-CM | POA: Diagnosis not present

## 2024-01-23 NOTE — Progress Notes (Signed)
 Rae Bugler, D.O.  ABFM, ABOM Specializing in Clinical Bariatric Medicine  Office located at: [address] [place], [place]  [address]   Assessment and Plan:  No orders of the defined types were placed in this encounter.  There are no discontinued medications.   No orders of the defined types were placed in this encounter.    FOR THE DISEASE OF OBESITY:  BMI 34.0-34.9,adult -- Current BMI 34.98 Morbid obesity (HCC)-starting bmi 09/05/23-38.61 Assessment & Plan: Since last office visit on 01/03/2024, patient's muscle mass has increased by 0.2 lbs. Fat mass has increased by 3.6 lbs. Total body water has increased by 6.2 lbs.  Counseling done on how various foods will affect these numbers and how to maximize success  Total lbs lost to date: 31 lbs Total weight loss percentage to date: 9.39%    Recommended Dietary Goals Sean Hurst is currently in the action stage of change. As such, his goal is to continue weight management plan.  He has agreed to: continue current plan   Behavioral Intervention We discussed the following today: increasing lean protein intake to established goals, increasing lower glycemic fruits, and continue to work on implementation of reduced calorie nutritional plan  Additional resources provided today: None  Evidence-based interventions for health behavior change were utilized today including the discussion of self monitoring techniques, problem-solving barriers and SMART goal setting techniques.   Regarding patient's less desirable eating habits and patterns, we employed the technique of small changes.   Pt will specifically work on: Sticking to MP 80% of the time for next visit.    Recommended Physical Activity Goals Sean Hurst has been advised to work up to 300-450 minutes of moderate intensity aerobic activity a week and strengthening exercises 2-3 times per week for cardiovascular health, weight loss maintenance and preservation of muscle mass.    He has agreed to :  Continue current level of physical activity    Pharmacotherapy We both agreed to : continue with nutritional and behavioral strategies   FOR ASSOCIATED CONDITIONS ADDRESSED TODAY:  Vitamin D  deficiency Assessment & Plan: Sean Hurst is taking Cholecalciferol 2000 units daily. He is compliant with supplement, Regimen well tolerated, no adverse reactions reported. Continue at current dose. Will regularly check levels every 3-4 months.    Closed nondisplaced fracture of shaft of second metacarpal bone of right hand with routine healing, subsequent encounter Assessment & Plan: Sean Hurst reports breaking his index finger last [place] He went to urgent care where he received a finger splint, this is relieving the pain per pt. Sean Hurst is going to an orthopedist tomorrow to reassess and receive proper treatment regimen. Additionally, pt admits injury has been hindering his exercise. Advised Sean Hurst to rest, ice, and elevate for pain and management.   Follow up:   Return in about 2 weeks (around 02/06/2024). He was informed of the importance of frequent follow up visits to maximize his success with intensive lifestyle modifications for his multiple health conditions.  Subjective:   Chief complaint: Obesity Sean Hurst is here to discuss his progress with his obesity treatment plan. He is keeping a food journal and adhering to recommended goals of 2000-2100 calories and 130-150g of protein using the Category 3 plan as a guide and states he is following his eating plan approximately 50% of the time. He states he is walking and doing cardio 30 minutes 4 days per week.  Interval History:  Sean Hurst is here for a follow up  office visit. Since last OV on 01/03/2024, pt is up 4 lbs. He recently went to [facility] for Sean Hurst. During this trip pt broke his finger. Additionally, Kym admits to eating off plan while out of town.   Pharmacotherapy for weight loss: He is  currently taking no anti-obesity medication.   Review of Systems:  Pertinent positives were addressed with patient today.  Reviewed by clinician on day of visit: allergies, medications, problem list, medical history, surgical history, family history, social history, and previous encounter notes.  Weight Summary and Biometrics   Weight Lost Since Last Visit: 0lb  Weight Gained Since Last Visit: 4lb   Vitals Temp: 98.1 F (36.7 C) BP: 110/70 Pulse Rate: 63 SpO2: 98 %   Anthropometric Measurements Height: 6' 5.5" (1.969 m) Weight: 299 lb (135.6 kg) BMI (Calculated): 34.98 Weight at Last Visit: 306lb Weight Lost Since Last Visit: 0lb Weight Gained Since Last Visit: 4lb Starting Weight: 330lb Total Weight Loss (lbs): 31 lb (14.1 kg) Peak Weight: 347lb   Body Composition  Body Fat %: 33 % Fat Mass (lbs): 98.6 lbs Muscle Mass (lbs): 190.6 lbs Total Body Water (lbs): 144.6 lbs Visceral Fat Rating : 17   Other Clinical Data Fasting: No Labs: No Today's Visit #: 8 Starting Date: 09/05/23    Objective:   PHYSICAL EXAM: Blood pressure 110/70, pulse 63, temperature 98.1 F (36.7 C), height 6' 5.5" (1.969 m), weight 299 lb (135.6 kg), SpO2 98%. Body mass index is 35 kg/m.  General: he is overweight, cooperative and in no acute distress. PSYCH: Has normal mood, affect and thought process.   HEENT: EOMI, sclerae are anicteric. Lungs: Normal breathing effort, no conversational dyspnea. Extremities: Moves * 4 Neurologic: A and O * 3, good insight  DIAGNOSTIC DATA REVIEWED: BMET    Component Value Date/Time   NA 141 08/05/2023 0000   K 4.3 08/05/2023 0000   CL 101 08/05/2023 0000   CO2 25 (A) 08/05/2023 0000   BUN 16 08/05/2023 0000   CREATININE 1.1 08/05/2023 0000   CALCIUM 9.3 08/05/2023 0000   Lab Results  Component Value Date   HGBA1C 5.5 12/20/2023   HGBA1C 5.7 08/05/2023   Lab Results  Component Value Date   INSULIN  12.2 09/05/2023   No results  found for: "TSH" CBC    Component Value Date/Time   WBC 5.1 08/05/2023 0000   RBC 4.46 08/05/2023 0000   HGB 13.5 08/05/2023 0000   HCT 40 (A) 08/05/2023 0000   PLT 175 08/05/2023 0000   Iron Studies No results found for: "IRON", "TIBC", "FERRITIN", "IRONPCTSAT" Lipid Panel     Component Value Date/Time   CHOL 200 08/05/2023 0000   TRIG 173 (A) 08/05/2023 0000   HDL 51 08/05/2023 0000   LDLCALC 120 08/05/2023 0000   Hepatic Function Panel     Component Value Date/Time   ALBUMIN 4.4 08/05/2023 0000   AST 16 08/05/2023 0000   ALT 18 08/05/2023 0000   ALKPHOS 76 08/05/2023 0000   No results found for: "TSH" Nutritional Lab Results  Component Value Date   VD25OH 55.3 12/20/2023   VD25OH 36.7 08/05/2023    Attestations:   I, Camryn Mix, acting as a Stage manager for [employer], DO., have compiled all relevant documentation for today's office visit on behalf of Marceil Sensor, DO, while in the presence of [employer], DO.  I have reviewed the above documentation for accuracy and completeness, and I agree with the above. Marietta Shorter Aqua Denslow, D.O.  The 21st Century Cures Act was signed into law in 2016 which includes the topic of electronic health records.  This provides immediate access to information in MyChart.  This includes consultation notes, operative notes, office notes, lab results and pathology reports.  If you have any questions about what you read please let us  know at your next visit so we can discuss your concerns and take corrective action if need be.  We are right here with you.

## 2024-02-06 ENCOUNTER — Ambulatory Visit (INDEPENDENT_AMBULATORY_CARE_PROVIDER_SITE_OTHER): Admitting: Family Medicine

## 2024-02-20 ENCOUNTER — Encounter (INDEPENDENT_AMBULATORY_CARE_PROVIDER_SITE_OTHER): Payer: Self-pay | Admitting: Adult Health

## 2024-02-20 ENCOUNTER — Ambulatory Visit (INDEPENDENT_AMBULATORY_CARE_PROVIDER_SITE_OTHER): Admitting: Adult Health

## 2024-02-20 VITALS — BP 111/57 | HR 60 | Temp 97.5°F | Ht 77.5 in | Wt 297.0 lb

## 2024-02-20 DIAGNOSIS — S62350D Nondisplaced fracture of shaft of second metacarpal bone, right hand, subsequent encounter for fracture with routine healing: Secondary | ICD-10-CM | POA: Diagnosis not present

## 2024-02-20 DIAGNOSIS — E669 Obesity, unspecified: Secondary | ICD-10-CM

## 2024-02-20 DIAGNOSIS — R7303 Prediabetes: Secondary | ICD-10-CM

## 2024-02-20 DIAGNOSIS — Z6834 Body mass index (BMI) 34.0-34.9, adult: Secondary | ICD-10-CM

## 2024-02-20 DIAGNOSIS — E559 Vitamin D deficiency, unspecified: Secondary | ICD-10-CM | POA: Diagnosis not present

## 2024-02-21 NOTE — Progress Notes (Signed)
 WEIGHT SUMMARY AND BIOMETRICS  Vitals Temp: (!) 97.5 F (36.4 C) BP: (!) 111/57 Pulse Rate: 60 SpO2: 99 %   Anthropometric Measurements Height: 6' 5.5" (1.969 m) Weight: 297 lb (134.7 kg) BMI (Calculated): 34.75 Weight at Last Visit: 299 lb Weight Lost Since Last Visit: 2 lb Weight Gained Since Last Visit: 0 Starting Weight: 330 lb Total Weight Loss (lbs): 33 lb (15 kg) Peak Weight: 347 lb   Body Composition  Body Fat %: 32.9 % Fat Mass (lbs): 98 lbs Muscle Mass (lbs): 189.8 lbs Total Body Water (lbs): 142 lbs Visceral Fat Rating : 17   Other Clinical Data Fasting: no Labs: no Today's Visit #: 9 Starting Date: 09/05/23    Chief Complaint:   OBESITY Sean Hurst is here to discuss his progress with his obesity treatment plan.  He is on the the Category 3 Plan and states he is following his eating plan approximately 70 % of the time.  He states he is exercising: NEAT Activities  Interim History:  Sean Hurst provided the following food recall that is typical of a day: Breakfast: 2-3 eggs, greek yogurt, either [place] bacon or deli meat. 30 oz coffee- will sip on until noon, then convert to water Lunch: Meat protein with side dish (rice) Dinner: Meat protein with vegetables He estimates to walk 10-15K steps per day when working- [employer]   Closed nondisplaced fracture of shaft of second metacarpal bone of right hand - this has prevented him from strength training. He has a well equipped home gym: [employer]  Subjective:   1. Vitamin D  deficiency  Latest Reference Range & Units 08/05/23 00:00 12/20/23 16:36  Vitamin D , 25-Hydroxy 30.0 - 100.0 ng/mL 36.7 (E) 55.3  (E): External lab result  He will cycle OTC Vit D3 therapy Fall/Winter: 4,000 international units daily Spring/Summer: 2,000 international units daily  2. Prediabetes Lab Results  Component Value Date   HGBA1C 5.5  12/20/2023   HGBA1C 5.7 08/05/2023     Latest Reference Range & Units 09/05/23 09:02  INSULIN  2.6 - 24.9 uIU/mL 12.2   He denies polyphagia and is not on any antidiabetic medications  3. Closed nondisplaced fracture of shaft of second metacarpal bone of right hand with routine healing, subsequent encounter He is R Hand dominant Suffered injury while cutting wood [place] He has had f/u with Orthopedic Specialist He stable in R hand brace- able to work and perform ADLs He has been unable to strength train/exercise at high intensity since injury  Assessment/Plan:   1. Vitamin D  deficiency (Primary) Monitor Labs  2. Prediabetes Continue to increase daily protein intake  3. Closed nondisplaced fracture of shaft of second metacarpal bone of right hand with routine healing, subsequent encounter F/u with Orthopedic Specialist as directed  4. BMI 34.0-34.9,adult -- Current BMI 34.8  Sean Hurst is currently in the action stage of change. As such, his goal is to continue with weight loss efforts. He has agreed to the Category 3 Plan.   Exercise goals: All adults should avoid inactivity. Some physical activity is better than none, and adults who participate in any amount of physical activity gain some health benefits. Adults should also include muscle-strengthening activities that involve all major muscle groups on 2 or more days a week.  Behavioral modification strategies: increasing lean protein intake, decreasing simple carbohydrates, increasing vegetables, increasing water intake, no  skipping meals, meal planning and cooking strategies, keeping healthy foods in the home, ways to avoid boredom eating, and planning for success.  Sean Hurst has agreed to follow-up with our clinic in 4 weeks. He was informed of the importance of frequent follow-up visits to maximize his success with intensive lifestyle modifications for his multiple health conditions.    Objective:   Blood pressure (!) 111/57, pulse 60, temperature (!) 97.5 F (36.4 C), height 6' 5.5" (1.969 m), weight 297 lb (134.7 kg), SpO2 99%. Body mass index is 34.77 kg/m.  General: Cooperative, alert, well developed, in no acute distress. HEENT: Conjunctivae and lids unremarkable. Cardiovascular: Regular rhythm.  Lungs: Normal work of breathing. Neurologic: No focal deficits.   Lab Results  Component Value Date   CREATININE 1.1 08/05/2023   BUN 16 08/05/2023   NA 141 08/05/2023   K 4.3 08/05/2023   CL 101 08/05/2023   CO2 25 (A) 08/05/2023   Lab Results  Component Value Date   ALT 18 08/05/2023   AST 16 08/05/2023   ALKPHOS 76 08/05/2023   Lab Results  Component Value Date   HGBA1C 5.5 12/20/2023   HGBA1C 5.7 08/05/2023   Lab Results  Component Value Date   INSULIN  12.2 09/05/2023   No results found for: "TSH" Lab Results  Component Value Date   CHOL 200 08/05/2023   HDL 51 08/05/2023   LDLCALC 120 08/05/2023   TRIG 173 (A) 08/05/2023   Lab Results  Component Value Date   VD25OH 55.3 12/20/2023   VD25OH 36.7 08/05/2023   Lab Results  Component Value Date   WBC 5.1 08/05/2023   HGB 13.5 08/05/2023   HCT 40 (A) 08/05/2023   PLT 175 08/05/2023   No results found for: "IRON", "TIBC", "FERRITIN"  Attestation Statements:   Reviewed by clinician on day of visit: allergies, medications, problem list, medical history, surgical history, family history, social history, and previous encounter notes.  I have reviewed the above documentation for accuracy and completeness, and I agree with the above. -  Sean Meegan d. Jasper Ruminski, NP-C

## 2024-03-12 ENCOUNTER — Encounter (INDEPENDENT_AMBULATORY_CARE_PROVIDER_SITE_OTHER): Payer: Self-pay | Admitting: Family Medicine

## 2024-03-12 ENCOUNTER — Ambulatory Visit (INDEPENDENT_AMBULATORY_CARE_PROVIDER_SITE_OTHER): Admitting: Family Medicine

## 2024-03-12 VITALS — BP 106/69 | HR 70 | Temp 98.2°F | Ht 77.5 in | Wt 298.0 lb

## 2024-03-12 DIAGNOSIS — E782 Mixed hyperlipidemia: Secondary | ICD-10-CM

## 2024-03-12 DIAGNOSIS — R7303 Prediabetes: Secondary | ICD-10-CM

## 2024-03-12 DIAGNOSIS — E559 Vitamin D deficiency, unspecified: Secondary | ICD-10-CM

## 2024-03-12 DIAGNOSIS — Z6834 Body mass index (BMI) 34.0-34.9, adult: Secondary | ICD-10-CM

## 2024-03-12 MED ORDER — ATORVASTATIN CALCIUM 10 MG PO TABS: 10.0000 mg | ORAL_TABLET | Freq: Every day | ORAL | 0 refills | Status: AC

## 2024-03-12 MED ORDER — EZETIMIBE 10 MG PO TABS: 10.0000 mg | ORAL_TABLET | Freq: Every day | ORAL | 0 refills | Status: AC

## 2024-03-12 NOTE — Progress Notes (Signed)
 Sean Hurst, D.O.  ABFM, ABOM Specializing in Clinical Bariatric Medicine  Office located at: [address] [place], [place]  [address]   Assessment and Plan:  No orders of the defined types were placed in this encounter.   Medications Discontinued During This Encounter  Medication Reason   ezetimibe  (ZETIA ) 10 MG tablet Reorder   atorvastatin  (LIPITOR) 10 MG tablet Reorder     Meds ordered this encounter  Medications   atorvastatin  (LIPITOR) 10 MG tablet    Sig: Take 1 tablet (10 mg total) by mouth daily.    Dispense:  90 tablet    Refill:  0    Future RF per PCP   ezetimibe  (ZETIA ) 10 MG tablet    Sig: Take 1 tablet (10 mg total) by mouth daily.    Dispense:  90 tablet    Refill:  0    Future RF per PCP      FOR THE DISEASE OF OBESITY: BMI 34.0-34.9,adult -- Current BMI 34.87 Morbid obesity (HCC)-starting bmi 09/05/23-38.61 Assessment & Plan: Since last office visit on 02/20/24 patient's muscle mass has increased by 1.6 lbs. Fat mass has decreased by 1.4 lb. Total body water has increased by 2 lbs.  Counseling done on how various foods will affect these numbers and how to maximize success  Total lbs lost to date: 32 lbs Total weight loss percentage to date: -9.70%   Recommended Dietary Goals Sean Hurst is currently in the action stage of change. As such, his goal is to continue weight management plan.  He has agreed to: continue current plan   Behavioral Intervention We discussed the following today: increasing lean protein intake to established goals, increasing water intake , decreasing eating out or consumption of processed foods, and making healthy choices when eating convenient foods, and avoiding temptations and identifying enticing environmental cues  Additional resources provided today: Eating out guide given and provided patient with personalized instruction on finding healthier options when eating out.    Evidence-based interventions for health  behavior change were utilized today including the discussion of self monitoring techniques, problem-solving barriers and SMART goal setting techniques.   Regarding patient's less desirable eating habits and patterns, we employed the technique of small changes.   Pt will specifically work on: n/a   Recommended Physical Activity Goals Sean Hurst has been advised to work up to 300-450 minutes of moderate intensity aerobic activity a week and strengthening exercises 2-3 times per week for cardiovascular health, weight loss maintenance and preservation of muscle mass.   He has agreed to: continue to gradually increase the amount and intensity of exercise routine as he continues to heal from finger injury.   Pharmacotherapy We both agreed to: Continue with current nutritional and behavioral strategies   ASSOCIATED CONDITIONS ADDRESSED TODAY: Mixed hyperlipidemia Assessment & Plan: Lab Results  Component Value Date   CHOL 200 08/05/2023   HDL 51 08/05/2023   LDLCALC 120 08/05/2023   TRIG 173 (A) 08/05/2023   Compliant with Zetia  10 mg once daily. Tolerating well, no side effects. Pt ran out of Lipitor and unable to take for about 1 month. Recently resumed exercise after finger injury, is healing well overall. Reports some off plan eating since last visit.  Continue with current medication regimen, or refill Lipitor and Zetia  today. Encouraged patient to avoid eating out. Work on decreasing simple carbs/sugars, avoiding fatty meats, and decreasing saturated/trans fats --> Follow heart, healthy meal plan. Continue with current exercise routine as able and  encouraged aerobic exercise when strength training is not possible.  Orders: - Refill Lipitor, no dose - Refill Zetia , no dose   Prediabetes Assessment & Plan: Lab Results  Component Value Date   HGBA1C 5.5 12/20/2023   HGBA1C 5.7 08/05/2023   INSULIN  12.2 09/05/2023    Not on any meds; diet/exercise approach. Hunger/cravings well  controlled. He was unable to continue with regular exercise routine due to a finger injury. His finger is healing well and pt recently resumed exercise. Drinks half a gallon of water daily. Encourage patient to continue efforts towards getting back on track with his meal plan and exercise as able. Increase water intake to at least half body weight of ounces of water per day. Continue monitoring.   Vitamin D  deficiency Assessment & Plan: Lab Results  Component Value Date   VD25OH 55.3 12/20/2023   VD25OH 36.7 08/05/2023   Compliant with OTC vitamin D  2,000 units daily. Tolerating well, no side effects. No concerns today. Continue with current supplementation regimen. Recheck levels periodically.   Follow up:   Return in about 4 weeks (around 04/09/2024). He was informed of the importance of frequent follow up visits to maximize his success with intensive lifestyle modifications for his multiple health conditions.  Subjective:   Chief complaint: Obesity Sean Hurst is here to discuss his progress with his obesity treatment plan. He is on the Category 3 Plan and states he is following his eating plan approximately 50% of the time. He states he is doing cross-fit and walking 60  minutes 4 days per week.   Interval History:  Sean Hurst is here for a follow up office visit. Since last OV on 01/31/24, he is up 1 lb. He plans to visit his father in [place]  soon and reports he will continue to try to eat on plan. He was previously unable to exercise in the gym due to him having a fracture of 2nd metacarpal of right hand. He had surgery and is healing overall well. Has been focusing on eating protein and vegetables. Not measureing. Avoids eating deserts. Hunger/cravings well controlled. Drinks half a gallon of water per day.   Pharmacotherapy for weight loss: He is currently taking no anti-obesity medication.   Review of Systems:  Pertinent positives were addressed with patient today.  Reviewed by  clinician on day of visit: allergies, medications, problem list, medical history, surgical history, family history, social history, and previous encounter notes.  Weight Summary and Biometrics   Weight Lost Since Last Visit: 0lb  Weight Gained Since Last Visit: 1lb   Vitals Temp: 98.2 F (36.8 C) BP: 106/69 Pulse Rate: 70 SpO2: 98 %   Anthropometric Measurements Height: 6' 5.5 (1.969 m) Weight: 298 lb (135.2 kg) BMI (Calculated): 34.87 Weight at Last Visit: 297lb Weight Lost Since Last Visit: 0lb Weight Gained Since Last Visit: 1lb Starting Weight: 330lb Total Weight Loss (lbs): 32 lb (14.5 kg) Peak Weight: 347lb   Body Composition  Body Fat %: 32.4 % Fat Mass (lbs): 96.6 lbs Muscle Mass (lbs): 191.4 lbs Total Body Water (lbs): 144 lbs Visceral Fat Rating : 17   Other Clinical Data Fasting: No Labs: No Today's Visit #: 10 Starting Date: 09/05/23    Objective:   PHYSICAL EXAM: Blood pressure 106/69, pulse 70, temperature 98.2 F (36.8 C), height 6' 5.5 (1.969 m), weight 298 lb (135.2 kg), SpO2 98%. Body mass index is 34.88 kg/m.  General: he is overweight, cooperative and in no acute distress. PSYCH: Has normal mood,  affect and thought process.   HEENT: EOMI, sclerae are anicteric. Lungs: Normal breathing effort, no conversational dyspnea. Extremities: Moves * 4 Neurologic: A and O * 3, good insight  DIAGNOSTIC DATA REVIEWED: BMET    Component Value Date/Time   NA 141 08/05/2023 0000   K 4.3 08/05/2023 0000   CL 101 08/05/2023 0000   CO2 25 (A) 08/05/2023 0000   BUN 16 08/05/2023 0000   CREATININE 1.1 08/05/2023 0000   CALCIUM  9.3 08/05/2023 0000   Lab Results  Component Value Date   HGBA1C 5.5 12/20/2023   HGBA1C 5.7 08/05/2023   Lab Results  Component Value Date   INSULIN  12.2 09/05/2023   No results found for: TSH CBC    Component Value Date/Time   WBC 5.1 08/05/2023 0000   RBC 4.46 08/05/2023 0000   HGB 13.5 08/05/2023 0000    HCT 40 (A) 08/05/2023 0000   PLT 175 08/05/2023 0000   Iron Studies No results found for: IRON, TIBC, FERRITIN, IRONPCTSAT Lipid Panel     Component Value Date/Time   CHOL 200 08/05/2023 0000   TRIG 173 (A) 08/05/2023 0000   HDL 51 08/05/2023 0000   LDLCALC 120 08/05/2023 0000   Hepatic Function Panel     Component Value Date/Time   ALBUMIN 4.4 08/05/2023 0000   AST 16 08/05/2023 0000   ALT 18 08/05/2023 0000   ALKPHOS 76 08/05/2023 0000   No results found for: TSH Nutritional Lab Results  Component Value Date   VD25OH 55.3 12/20/2023   VD25OH 36.7 08/05/2023    Attestations:   I, Bernita Bristle, acting as a medical scribe for Marceil Sensor, DO., have compiled all relevant documentation for today's office visit on behalf of Marceil Sensor, DO, while in the presence of [employer], DO.  Reviewed by clinician on day of visit: allergies, medications, problem list, medical history, surgical history, family history, social history, and previous encounter notes pertinent to patient's obesity diagnosis.  I have reviewed the above documentation for accuracy and completeness, and I agree with the above. Sean Hurst, D.O.  The 21st Century Cures Act was signed into law in 2016 which includes the topic of electronic health records.  This provides immediate access to information in MyChart.  This includes consultation notes, operative notes, office notes, lab results and pathology reports.  If you have any questions about what you read please let us  know at your next visit so we can discuss your concerns and take corrective action if need be.  We are right here with you.

## 2024-04-02 ENCOUNTER — Ambulatory Visit (INDEPENDENT_AMBULATORY_CARE_PROVIDER_SITE_OTHER): Admitting: Family Medicine

## 2024-04-23 ENCOUNTER — Ambulatory Visit (INDEPENDENT_AMBULATORY_CARE_PROVIDER_SITE_OTHER): Admitting: Adult Health

## 2024-04-23 ENCOUNTER — Encounter (INDEPENDENT_AMBULATORY_CARE_PROVIDER_SITE_OTHER): Payer: Self-pay | Admitting: Adult Health

## 2024-04-23 VITALS — BP 100/63 | HR 68 | Temp 98.4°F | Ht 77.5 in | Wt 296.0 lb

## 2024-04-23 DIAGNOSIS — E559 Vitamin D deficiency, unspecified: Secondary | ICD-10-CM | POA: Diagnosis not present

## 2024-04-23 DIAGNOSIS — R7303 Prediabetes: Secondary | ICD-10-CM | POA: Diagnosis not present

## 2024-04-23 DIAGNOSIS — E669 Obesity, unspecified: Secondary | ICD-10-CM | POA: Diagnosis not present

## 2024-04-23 DIAGNOSIS — R252 Cramp and spasm: Secondary | ICD-10-CM

## 2024-04-23 DIAGNOSIS — Z6834 Body mass index (BMI) 34.0-34.9, adult: Secondary | ICD-10-CM

## 2024-04-23 NOTE — Progress Notes (Unsigned)
 WEIGHT SUMMARY AND BIOMETRICS  Vitals Temp: 98.4 F (36.9 C) BP: 100/63 Pulse Rate: 68 SpO2: 96 %   Anthropometric Measurements Height: 6' 5.5 (1.969 m) Weight: 296 lb (134.3 kg) BMI (Calculated): 34.63 Weight at Last Visit: 298 lb Weight Lost Since Last Visit: 2 lb Weight Gained Since Last Visit: 0 Starting Weight: 330 lb Total Weight Loss (lbs): 34 lb (15.4 kg) Peak Weight: 347 lb   Body Composition  Body Fat %: 32.7 % Fat Mass (lbs): 97 lbs Muscle Mass (lbs): 189.8 lbs Total Body Water (lbs): 142.6 lbs Visceral Fat Rating : 17   Other Clinical Data Fasting: no Labs: no Today's Visit #: 11 Starting Date: 09/05/23    Chief Complaint:   OBESITY Sean Hurst is here to discuss his progress with his obesity treatment plan.  He is on the the Category 3 Plan and states he is following his eating plan approximately 70 % of the time.  He states he is exercising Cross Fit 60 minutes 4 times per week.  Interim History:  He is married with two children, ages 79 and 10 He and his family will travel to [place]- air travel and car travel Recommend wearing compression socks- researched options during OV  He has been experiencing evening bilateral muscle cramps in calf muscles  Hydration-he estimates to drink >120 oz water/day  Of note- [facility] is not currently managing any Rx at this time  Subjective:   1. Muscle cramps at night Sean Hurst has been experiencing bilateral calf cramping in the evenings. He estimates to drink around a gallon water per day. If often works outside in the extreme heat He is not on MVI or Mg++ supplementation  2. Prediabetes Lab Results  Component Value Date   HGBA1C 5.5 12/20/2023   HGBA1C 5.7 08/05/2023    He endores stable appetite He has focused on protein intake at each meal and with snacks  3. Vitamin D  deficiency  Latest Reference Range & Units 12/20/23 16:36  Vitamin D , 25-Hydroxy 30.0 - 100.0 ng/mL 55.3   Vit D level  stable and at goal He endorses stable energy levels  Assessment/Plan:   1. Muscle cramps at night Start OTC Magnesium bisglycinate  2. Prediabetes (Primary) Continue to increase protein and regular exercise  3. Vitamin D  deficiency Monitor Labs  4. BMI 34.0-34.9,adult -- Current BMI 34.7  Sean Hurst is currently in the action stage of change. As such, his goal is to continue with weight loss efforts. He has agreed to the Category 3 Plan.   1) Obtain and wear compression socks for upcoming air travel  Exercise goals: For substantial health benefits, adults should do at least 150 minutes (2 hours and 30 minutes) a week of moderate-intensity, or 75 minutes (1 hour and 15 minutes) a week of vigorous-intensity aerobic physical activity, or an equivalent combination of moderate- and vigorous-intensity aerobic activity. Aerobic activity should be performed in episodes of at least 10 minutes, and preferably, it should be spread throughout the week.  Behavioral modification strategies: increasing lean protein intake, decreasing simple carbohydrates, increasing vegetables, increasing water intake, no skipping meals, meal planning and cooking strategies, keeping healthy foods in the home, ways to avoid boredom eating, and planning for success.  Sean Hurst has agreed to follow-up with our clinic in 4 weeks. He was informed of the importance of frequent follow-up visits to maximize his success with intensive lifestyle modifications for his multiple health conditions.   Objective:   Blood pressure 100/63, pulse 68,  temperature 98.4 F (36.9 C), height 6' 5.5 (1.969 m), weight 296 lb (134.3 kg), SpO2 96%. Body mass index is 34.65 kg/m.  General: Cooperative, alert, well developed, in no acute distress. HEENT: Conjunctivae and lids unremarkable. Cardiovascular: Regular rhythm.  Lungs: Normal work of breathing. Neurologic: No focal deficits.   Lab Results  Component Value Date   CREATININE 1.1  08/05/2023   BUN 16 08/05/2023   NA 141 08/05/2023   K 4.3 08/05/2023   CL 101 08/05/2023   CO2 25 (A) 08/05/2023   Lab Results  Component Value Date   ALT 18 08/05/2023   AST 16 08/05/2023   ALKPHOS 76 08/05/2023   Lab Results  Component Value Date   HGBA1C 5.5 12/20/2023   HGBA1C 5.7 08/05/2023   Lab Results  Component Value Date   INSULIN  12.2 09/05/2023   No results found for: TSH Lab Results  Component Value Date   CHOL 200 08/05/2023   HDL 51 08/05/2023   LDLCALC 120 08/05/2023   TRIG 173 (A) 08/05/2023   Lab Results  Component Value Date   VD25OH 55.3 12/20/2023   VD25OH 36.7 08/05/2023   Lab Results  Component Value Date   WBC 5.1 08/05/2023   HGB 13.5 08/05/2023   HCT 40 (A) 08/05/2023   PLT 175 08/05/2023   No results found for: IRON, TIBC, FERRITIN  Attestation Statements:   Reviewed by clinician on day of visit: allergies, medications, problem list, medical history, surgical history, family history, social history, and previous encounter notes.  I have reviewed the above documentation for accuracy and completeness, and I agree with the above. -  Cleda Imel d. Jacqulene Huntley, NP-C

## 2024-05-14 ENCOUNTER — Ambulatory Visit (INDEPENDENT_AMBULATORY_CARE_PROVIDER_SITE_OTHER): Admitting: Family Medicine

## 2024-05-15 ENCOUNTER — Encounter (INDEPENDENT_AMBULATORY_CARE_PROVIDER_SITE_OTHER): Payer: Self-pay | Admitting: Adult Health

## 2024-05-15 ENCOUNTER — Ambulatory Visit (INDEPENDENT_AMBULATORY_CARE_PROVIDER_SITE_OTHER): Admitting: Adult Health

## 2024-05-15 VITALS — BP 102/67 | HR 71 | Temp 97.9°F | Ht 77.5 in | Wt 297.0 lb

## 2024-05-15 DIAGNOSIS — Z6834 Body mass index (BMI) 34.0-34.9, adult: Secondary | ICD-10-CM

## 2024-05-15 DIAGNOSIS — E559 Vitamin D deficiency, unspecified: Secondary | ICD-10-CM

## 2024-05-15 DIAGNOSIS — Z6835 Body mass index (BMI) 35.0-35.9, adult: Secondary | ICD-10-CM

## 2024-05-15 DIAGNOSIS — E782 Mixed hyperlipidemia: Secondary | ICD-10-CM | POA: Diagnosis not present

## 2024-05-15 DIAGNOSIS — E669 Obesity, unspecified: Secondary | ICD-10-CM | POA: Diagnosis not present

## 2024-05-15 DIAGNOSIS — R7303 Prediabetes: Secondary | ICD-10-CM | POA: Diagnosis not present

## 2024-05-15 NOTE — Progress Notes (Signed)
 WEIGHT SUMMARY AND BIOMETRICS  Vitals Temp: 97.9 F (36.6 C) BP: 102/67 Pulse Rate: 71 SpO2: 96 %   Anthropometric Measurements Height: 6' 5.5 (1.969 m) Weight: 297 lb (134.7 kg) BMI (Calculated): 34.75 Weight at Last Visit: 296lb Weight Lost Since Last Visit: 0lb Weight Gained Since Last Visit: 1lb Starting Weight: 330lb Total Weight Loss (lbs): 33 lb (15 kg) Peak Weight: 347lb   Body Composition  Body Fat %: 31.9 % Fat Mass (lbs): 95 lbs Muscle Mass (lbs): 192.8 lbs Total Body Water (lbs): 139.6 lbs Visceral Fat Rating : 17   Other Clinical Data Fasting: No Labs: no Today's Visit #: 12 Starting Date: 09/05/23    Chief Complaint:   OBESITY Sean Hurst is here to discuss his progress with his obesity treatment plan.  He is on the the Category 3 Plan and states he is following his eating plan approximately 50 % of the time.  He states he is exercising Strength Training/Cardio 40-60 minutes 3-4 times per week.  Interim History:  He recently returned from a week long family vacation in [place].  Reviewed Bioempidence results with pt: Muscle Mass: +3 lbs Adipose Mass: -2 lbs  Hydration-he reports drinking between 90-120 oz water daily   09/05/23 07:00  Height 6' 5.5 (1.969 m)  Weight 330 lb (149.7 kg) (H)  BMI (Calculated) 38.61    09/05/23 07:00  RMR 2736    05/15/24 10:00  Weight 297 lb (134.7 kg)  BMI (Calculated) 34.75  Total Weight Loss (lbs) 33 lb (15 kg)   Subjective:   1. Prediabetes  Latest Reference Range & Units 08/05/23 00:00 09/05/23 09:02 12/20/23 16:36  Hemoglobin A1C 4.8 - 5.6 % 5.7 (E)  5.5  Est. average glucose Bld gHb Est-mCnc mg/dL   888  INSULIN 2.6 - 75.0 uIU/mL  12.2   (E): External lab result  He endorses stable appetite He is not on any antidiabetic medication at this time.  2. Mixed hyperlipidemia Lipid Panel     Component Value Date/Time   CHOL 200 08/05/2023 0000   TRIG 173 (A) 08/05/2023 0000   HDL 51  08/05/2023 0000   LDLCALC 120 08/05/2023 0000    He denies tobacco/vape use He is on daily Zetia 10mg  and Lipitos 10mg  he reports drinking between 90-120 oz water daily  3. Vitamin D deficiency  Latest Reference Range & Units 08/05/23 00:00 12/20/23 16:36  Vitamin D, 25-Hydroxy 30.0 - 100.0 ng/mL 36.7 (E) 55.3  (E): External lab result  He endorses stable energy levels He continues to exercise at high intensity 4 times weekly  Assessment/Plan:   1. Prediabetes (Primary) Continue Cat 3 MP and regular exercise  2. Mixed hyperlipidemia Continue Cat 3 MP and regular exercise Continue current Rx regime Remain well hydrated with water   3. Vitamin D deficiency Check Labs this Fall  4. BMI 35.0-35.9,adult - Current BMI 34.8  Sean Hurst is currently in the action stage of change. As such, his goal is to continue with weight loss efforts. He has agreed to the Category 3 Plan.   Exercise goals: For substantial health benefits, adults should do at least 150 minutes (2 hours and 30 minutes) a week of moderate-intensity, or 75 minutes (1 hour and 15 minutes) a week of vigorous-intensity aerobic physical activity, or an equivalent combination of moderate- and vigorous-intensity aerobic activity. Aerobic activity should be performed in episodes of at least 10 minutes, and preferably, it should be spread throughout the week.  Behavioral modification strategies:  increasing lean protein intake, decreasing simple carbohydrates, increasing vegetables, increasing water intake, meal planning and cooking strategies, keeping healthy foods in the home, ways to avoid boredom eating, and planning for success.  Sean Hurst has agreed to follow-up with our clinic in 4 weeks. He was informed of the importance of frequent follow-up visits to maximize his success with intensive lifestyle modifications for his multiple health conditions.   Check Fasting Labs and IC at Nov 2025 OV.  Pt aware 30 mins prior to OV and to be  fasting  Objective:   Blood pressure 102/67, pulse 71, temperature 97.9 F (36.6 C), height 6' 5.5 (1.969 m), weight 297 lb (134.7 kg), SpO2 96%. Body mass index is 34.77 kg/m.  General: Cooperative, alert, well developed, in no acute distress. HEENT: Conjunctivae and lids unremarkable. Cardiovascular: Regular rhythm.  Lungs: Normal work of breathing. Neurologic: No focal deficits.   Lab Results  Component Value Date   CREATININE 1.1 08/05/2023   BUN 16 08/05/2023   NA 141 08/05/2023   K 4.3 08/05/2023   CL 101 08/05/2023   CO2 25 (A) 08/05/2023   Lab Results  Component Value Date   ALT 18 08/05/2023   AST 16 08/05/2023   ALKPHOS 76 08/05/2023   Lab Results  Component Value Date   HGBA1C 5.5 12/20/2023   HGBA1C 5.7 08/05/2023   Lab Results  Component Value Date   INSULIN 12.2 09/05/2023   No results found for: TSH Lab Results  Component Value Date   CHOL 200 08/05/2023   HDL 51 08/05/2023   LDLCALC 120 08/05/2023   TRIG 173 (A) 08/05/2023   Lab Results  Component Value Date   VD25OH 55.3 12/20/2023   VD25OH 36.7 08/05/2023   Lab Results  Component Value Date   WBC 5.1 08/05/2023   HGB 13.5 08/05/2023   HCT 40 (A) 08/05/2023   PLT 175 08/05/2023   No results found for: IRON, TIBC, FERRITIN  Attestation Statements:   Reviewed by clinician on day of visit: allergies, medications, problem list, medical history, surgical history, family history, social history, and previous encounter notes.  "26 minutes spent face-to-face with the patient discussing management of disordered eating symptoms, reviewing current medications, and providing strategies for coping with emotional eating."  I have reviewed the above documentation for accuracy and completeness, and I agree with the above. -  Baine Decesare d. Mitsuo Budnick, NP-C

## 2024-06-11 ENCOUNTER — Ambulatory Visit (INDEPENDENT_AMBULATORY_CARE_PROVIDER_SITE_OTHER): Admitting: Adult Health

## 2024-06-13 ENCOUNTER — Other Ambulatory Visit (INDEPENDENT_AMBULATORY_CARE_PROVIDER_SITE_OTHER): Payer: Self-pay | Admitting: Family Medicine

## 2024-06-13 DIAGNOSIS — E782 Mixed hyperlipidemia: Secondary | ICD-10-CM

## 2024-07-09 ENCOUNTER — Ambulatory Visit (INDEPENDENT_AMBULATORY_CARE_PROVIDER_SITE_OTHER): Admitting: Adult Health

## 2024-07-09 ENCOUNTER — Encounter (INDEPENDENT_AMBULATORY_CARE_PROVIDER_SITE_OTHER): Payer: Self-pay | Admitting: Adult Health

## 2024-07-09 VITALS — BP 119/83 | HR 68 | Temp 98.7°F | Ht 77.5 in | Wt 302.0 lb

## 2024-07-09 DIAGNOSIS — E559 Vitamin D deficiency, unspecified: Secondary | ICD-10-CM | POA: Diagnosis not present

## 2024-07-09 DIAGNOSIS — E782 Mixed hyperlipidemia: Secondary | ICD-10-CM

## 2024-07-09 DIAGNOSIS — R7303 Prediabetes: Secondary | ICD-10-CM

## 2024-07-09 DIAGNOSIS — Z6835 Body mass index (BMI) 35.0-35.9, adult: Secondary | ICD-10-CM | POA: Diagnosis not present

## 2024-07-09 NOTE — Progress Notes (Signed)
 WEIGHT SUMMARY AND BIOMETRICS  Vitals Temp: 98.7 F (37.1 C) BP: 119/83 Pulse Rate: 68 SpO2: 100 %   Anthropometric Measurements Height: 6' 5.5 (1.969 m) Weight: (!) 302 lb (137 kg) BMI (Calculated): 35.33 Weight at Last Visit: 297lb Weight Lost Since Last Visit: 0lb Weight Gained Since Last Visit: 5lb Starting Weight: 330lb Total Weight Loss (lbs): 28 lb (12.7 kg) Peak Weight: 347lb   Body Composition  Body Fat %: 32.7 % Fat Mass (lbs): 99 lbs Muscle Mass (lbs): 193.8 lbs Total Body Water (lbs): 146.2 lbs Visceral Fat Rating : 17   Other Clinical Data Fasting: No Labs: no Today's Visit #: 13 Starting Date: 09/05/23    Chief Complaint:   OBESITY Sean Hurst is here to discuss his progress with his obesity treatment plan.  He is on the the Category 3 Plan and states he is following his eating plan approximately 50 % of the time.  He states he is exercising: Physical Activity Monday through Friday, he will walk at least 10K steps per day.  Interim History:  He will often eat on plan for meals, then snack over his allotted calories in the evening. His wife keeps a cach of snacks for the teenagers in the house. Reviewed snack calorie guidelines and calorie ranges and protein goals for each meal.  Reviewed Bioimpedance Results with pt: Muscle Mass: + 1 lb Adipose Mass: + 4 lbs  He has not been exercising outside of work. He has well equipped home gym.   09/05/23 07:00  RMR 2736  Waist Measurement  48 inches    09/05/23 07:00  Height 6' 5.5 (1.969 m)  Weight 330 lb (149.7 kg) (H)  BMI (Calculated) 38.61  (H): Data is abnormally high   07/09/24 15:00  Height 6' 5.5 (1.969 m)  Weight 302 lb (137 kg) (H)  BMI (Calculated) 35.33  Total Weight Loss (lbs) 28 lb (12.7 kg)  (H): Data is abnormally high  Recommend checking RMR via IC at next OV Pt aware to arrive 30 mins prior to OV and to be fasting.  Subjective:   1. Mixed hyperlipidemia Lipid  Panel     Component Value Date/Time   CHOL 200 08/05/2023 0000   TRIG 173 (A) 08/05/2023 0000   HDL 51 08/05/2023 0000   LDLCALC 120 08/05/2023 0000   The 10-year ASCVD risk score (Arnett DK, et al., 2019) is: 2.7%*   Values used to calculate the score:     Age: 48 years     Clincally relevant sex: Male     Is Non-Hispanic African American: No     Diabetic: No     Tobacco smoker: No     Systolic Blood Pressure: 119 mmHg     Is BP treated: No     HDL Cholesterol: 51 mg/dL*     Total Cholesterol: 200 mg/dL*     * - Cholesterol units were assumed for this score calculation   He was started on daily Lipitor 10mg  by [facility] in June 2025 He denies tobacco/vape use He denies CP with exertion  2. Prediabetes Lab Results  Component Value Date   HGBA1C 5.5 12/20/2023   HGBA1C 5.7 08/05/2023    He is not on any antidiabetic medication at this time.  He will often eat on plan for meals, then snack over his allotted calories in the evening. His wife keeps a cach of snacks for the teenagers in the house. Reviewed snack calorie guidelines and calorie ranges and  protein goals for each meal.  3. Vitamin D  deficiency  Latest Reference Range & Units 08/05/23 00:00 12/20/23 16:36  Vitamin D , 25-Hydroxy 30.0 - 100.0 ng/mL 36.7 (E) 55.3  (E): External lab result  He takes OTC Vit D3 2000 international units during warmer/sunnier months.  He will increase to 4000 international units daily in Fall/Winter.  Assessment/Plan:   1. Mixed hyperlipidemia (Primary) Check Labs at next OV Increase strength training  2. Prediabetes Continue healthy eating and regular walking Increase strength training- at least once per week.  3. Vitamin D  deficiency Check Labs at nex OV  5. BMI 35.0-35.9,adult - Current BMI 35.4  Sean Hurst is currently in the action stage of change. As such, his goal is to continue with weight loss efforts. He has agreed to the Category 3 Plan.   Exercise goals: All adults should  avoid inactivity. Some physical activity is better than none, and adults who participate in any amount of physical activity gain some health benefits. Adults should also include muscle-strengthening activities that involve all major muscle groups on 2 or more days a week. Add in Strength Training at least one time per week.  Behavioral modification strategies: increasing lean protein intake, decreasing simple carbohydrates, increasing vegetables, increasing water intake, meal planning and cooking strategies, keeping healthy foods in the home, ways to avoid boredom eating, ways to avoid night time snacking, and planning for success.  Sean Hurst has agreed to follow-up with our clinic in 4 weeks. He was informed of the importance of frequent follow-up visits to maximize his success with intensive lifestyle modifications for his multiple health conditions.   Check Fasting Labs and IC at next OV. Pt aware 30 mins prior to OV and to be fasting   Objective:   Blood pressure 119/83, pulse 68, temperature 98.7 F (37.1 C), height 6' 5.5 (1.969 m), weight (!) 302 lb (137 kg), SpO2 100%. Body mass index is 35.35 kg/m.  General: Cooperative, alert, well developed, in no acute distress. HEENT: Conjunctivae and lids unremarkable. Cardiovascular: Regular rhythm.  Lungs: Normal work of breathing. Neurologic: No focal deficits.   Lab Results  Component Value Date   CREATININE 1.1 08/05/2023   BUN 16 08/05/2023   NA 141 08/05/2023   K 4.3 08/05/2023   CL 101 08/05/2023   CO2 25 (A) 08/05/2023   Lab Results  Component Value Date   ALT 18 08/05/2023   AST 16 08/05/2023   ALKPHOS 76 08/05/2023   Lab Results  Component Value Date   HGBA1C 5.5 12/20/2023   HGBA1C 5.7 08/05/2023   Lab Results  Component Value Date   INSULIN  12.2 09/05/2023   No results found for: TSH Lab Results  Component Value Date   CHOL 200 08/05/2023   HDL 51 08/05/2023   LDLCALC 120 08/05/2023   TRIG 173 (A) 08/05/2023    Lab Results  Component Value Date   VD25OH 55.3 12/20/2023   VD25OH 36.7 08/05/2023   Lab Results  Component Value Date   WBC 5.1 08/05/2023   HGB 13.5 08/05/2023   HCT 40 (A) 08/05/2023   PLT 175 08/05/2023   No results found for: IRON, TIBC, FERRITIN  Attestation Statements:   Reviewed by clinician on day of visit: allergies, medications, problem list, medical history, surgical history, family history, social history, and previous encounter notes.  Time spent on visit including pre-visit chart review and post-visit care and charting was 26 minutes.   I have reviewed the above documentation for accuracy and completeness,  and I agree with the above. -  Isella Slatten d. Shyhiem Beeney, NP-C

## 2024-08-04 ENCOUNTER — Encounter: Payer: Self-pay | Admitting: Radiology

## 2024-08-06 ENCOUNTER — Ambulatory Visit (INDEPENDENT_AMBULATORY_CARE_PROVIDER_SITE_OTHER): Payer: Self-pay | Admitting: Adult Health

## 2024-08-14 ENCOUNTER — Ambulatory Visit (INDEPENDENT_AMBULATORY_CARE_PROVIDER_SITE_OTHER): Payer: Self-pay | Admitting: Adult Health

## 2024-12-04 ENCOUNTER — Ambulatory Visit (INDEPENDENT_AMBULATORY_CARE_PROVIDER_SITE_OTHER): Admitting: Adult Health
